# Patient Record
Sex: Male | Born: 1970 | Race: White | Hispanic: No | Marital: Single | Smoking: Former smoker
Health system: Southern US, Community
[De-identification: ages and names within clinical notes are randomized; demographics above are authoritative.]

## PROBLEM LIST (undated history)

## (undated) DIAGNOSIS — F32A Depression, unspecified: Secondary | ICD-10-CM

---

## 2006-06-30 ENCOUNTER — Emergency Department (HOSPITAL_COMMUNITY): Admission: EM | Admit: 2006-06-30 | Discharge: 2006-06-30 | Payer: Self-pay | Admitting: Emergency Medicine

## 2006-10-15 ENCOUNTER — Emergency Department (HOSPITAL_COMMUNITY): Admission: EM | Admit: 2006-10-15 | Discharge: 2006-10-15 | Payer: Self-pay | Admitting: Emergency Medicine

## 2006-10-20 ENCOUNTER — Emergency Department (HOSPITAL_COMMUNITY): Admission: EM | Admit: 2006-10-20 | Discharge: 2006-10-21 | Payer: Self-pay | Admitting: Emergency Medicine

## 2006-10-22 ENCOUNTER — Ambulatory Visit: Payer: Self-pay | Admitting: Family Medicine

## 2006-10-23 ENCOUNTER — Ambulatory Visit: Payer: Self-pay | Admitting: Family Medicine

## 2006-10-29 ENCOUNTER — Emergency Department (HOSPITAL_COMMUNITY): Admission: EM | Admit: 2006-10-29 | Discharge: 2006-10-30 | Payer: Self-pay | Admitting: Emergency Medicine

## 2006-10-30 ENCOUNTER — Ambulatory Visit: Payer: Self-pay | Admitting: Family Medicine

## 2006-10-31 ENCOUNTER — Ambulatory Visit: Payer: Self-pay | Admitting: *Deleted

## 2006-11-06 ENCOUNTER — Ambulatory Visit: Payer: Self-pay | Admitting: Family Medicine

## 2006-11-13 ENCOUNTER — Ambulatory Visit: Payer: Self-pay | Admitting: Family Medicine

## 2007-03-01 DIAGNOSIS — R1013 Epigastric pain: Secondary | ICD-10-CM | POA: Insufficient documentation

## 2007-03-01 DIAGNOSIS — K0510 Chronic gingivitis, plaque induced: Secondary | ICD-10-CM | POA: Insufficient documentation

## 2008-03-07 ENCOUNTER — Emergency Department (HOSPITAL_COMMUNITY): Admission: EM | Admit: 2008-03-07 | Discharge: 2008-03-07 | Payer: Self-pay | Admitting: Emergency Medicine

## 2008-03-08 ENCOUNTER — Emergency Department (HOSPITAL_COMMUNITY): Admission: EM | Admit: 2008-03-08 | Discharge: 2008-03-08 | Payer: Self-pay | Admitting: Emergency Medicine

## 2008-03-12 ENCOUNTER — Emergency Department (HOSPITAL_COMMUNITY): Admission: EM | Admit: 2008-03-12 | Discharge: 2008-03-12 | Payer: Self-pay | Admitting: Emergency Medicine

## 2008-03-17 ENCOUNTER — Emergency Department (HOSPITAL_COMMUNITY): Admission: EM | Admit: 2008-03-17 | Discharge: 2008-03-17 | Payer: Self-pay | Admitting: Family Medicine

## 2008-07-31 ENCOUNTER — Emergency Department (HOSPITAL_COMMUNITY): Admission: EM | Admit: 2008-07-31 | Discharge: 2008-07-31 | Payer: Self-pay | Admitting: Family Medicine

## 2008-09-28 ENCOUNTER — Emergency Department (HOSPITAL_COMMUNITY): Admission: EM | Admit: 2008-09-28 | Discharge: 2008-09-28 | Payer: Self-pay | Admitting: Family Medicine

## 2008-11-08 ENCOUNTER — Emergency Department (HOSPITAL_COMMUNITY): Admission: EM | Admit: 2008-11-08 | Discharge: 2008-11-08 | Payer: Self-pay | Admitting: Emergency Medicine

## 2008-12-10 ENCOUNTER — Emergency Department (HOSPITAL_COMMUNITY): Admission: EM | Admit: 2008-12-10 | Discharge: 2008-12-10 | Payer: Self-pay | Admitting: Family Medicine

## 2009-02-02 ENCOUNTER — Emergency Department (HOSPITAL_COMMUNITY): Admission: EM | Admit: 2009-02-02 | Discharge: 2009-02-02 | Payer: Self-pay | Admitting: Emergency Medicine

## 2009-02-02 ENCOUNTER — Inpatient Hospital Stay (HOSPITAL_COMMUNITY): Admission: EM | Admit: 2009-02-02 | Discharge: 2009-02-03 | Payer: Self-pay | Admitting: Internal Medicine

## 2009-02-02 ENCOUNTER — Observation Stay (HOSPITAL_COMMUNITY): Admission: EM | Admit: 2009-02-02 | Discharge: 2009-02-02 | Payer: Self-pay | Admitting: Emergency Medicine

## 2009-02-02 ENCOUNTER — Ambulatory Visit: Payer: Self-pay | Admitting: Internal Medicine

## 2009-02-03 ENCOUNTER — Encounter: Payer: Self-pay | Admitting: Internal Medicine

## 2009-02-12 ENCOUNTER — Encounter: Payer: Self-pay | Admitting: Internal Medicine

## 2009-02-12 ENCOUNTER — Ambulatory Visit: Payer: Self-pay | Admitting: Infectious Diseases

## 2009-02-12 DIAGNOSIS — D709 Neutropenia, unspecified: Secondary | ICD-10-CM

## 2009-02-12 LAB — CONVERTED CEMR LAB
Basophils Absolute: 0 10*3/uL (ref 0.0–0.1)
Eosinophils Absolute: 0.1 10*3/uL (ref 0.0–0.7)
Eosinophils Relative: 2 % (ref 0–5)
HCT: 44.7 % (ref 39.0–52.0)
Hemoglobin: 15 g/dL (ref 13.0–17.0)
MCV: 81.1 fL (ref >=78.0)
Monocytes Absolute: 0.5 10*3/uL (ref 0.1–1.0)
Platelets: 182 10*3/uL (ref 150–400)
RBC: 5.51 M/uL (ref 4.22–5.81)
WBC: 5.2 10*3/uL (ref 4.0–10.5)

## 2009-02-17 ENCOUNTER — Ambulatory Visit: Payer: Self-pay | Admitting: Infectious Diseases

## 2009-02-17 DIAGNOSIS — L02619 Cutaneous abscess of unspecified foot: Secondary | ICD-10-CM | POA: Insufficient documentation

## 2009-02-17 DIAGNOSIS — L03119 Cellulitis of unspecified part of limb: Secondary | ICD-10-CM

## 2009-02-17 LAB — CONVERTED CEMR LAB
Basophils Relative: 1 % (ref 0–1)
Eosinophils Relative: 4 % (ref 0–5)
HCT: 45.3 % (ref 39.0–52.0)
Lymphs Abs: 2.8 10*3/uL (ref 0.7–4.0)
MCV: 81.5 fL (ref >=78.0)
Neutro Abs: 2.3 10*3/uL (ref 1.7–7.7)
Neutrophils Relative %: 39 % — ABNORMAL LOW (ref 43–77)
RBC: 5.56 M/uL (ref 4.22–5.81)
WBC: 6 10*3/uL (ref 4.0–10.5)

## 2009-03-07 ENCOUNTER — Emergency Department (HOSPITAL_COMMUNITY): Admission: EM | Admit: 2009-03-07 | Discharge: 2009-03-07 | Payer: Self-pay | Admitting: Emergency Medicine

## 2009-05-17 ENCOUNTER — Emergency Department (HOSPITAL_COMMUNITY): Admission: EM | Admit: 2009-05-17 | Discharge: 2009-05-17 | Payer: Self-pay | Admitting: Emergency Medicine

## 2009-05-20 ENCOUNTER — Emergency Department (HOSPITAL_COMMUNITY): Admission: EM | Admit: 2009-05-20 | Discharge: 2009-05-20 | Payer: Self-pay | Admitting: Emergency Medicine

## 2009-05-21 ENCOUNTER — Inpatient Hospital Stay (HOSPITAL_COMMUNITY): Admission: AD | Admit: 2009-05-21 | Discharge: 2009-05-23 | Payer: Self-pay | Admitting: Gastroenterology

## 2009-05-22 ENCOUNTER — Encounter (INDEPENDENT_AMBULATORY_CARE_PROVIDER_SITE_OTHER): Payer: Self-pay | Admitting: Gastroenterology

## 2009-12-02 ENCOUNTER — Emergency Department (HOSPITAL_COMMUNITY): Admission: EM | Admit: 2009-12-02 | Discharge: 2009-12-02 | Payer: Self-pay | Admitting: Emergency Medicine

## 2010-08-21 LAB — HERPES SIMPLEX VIRUS CULTURE

## 2010-09-05 LAB — DIFFERENTIAL
Blasts: 0 %
Lymphocytes Relative: 71 % — ABNORMAL HIGH (ref 12–46)
Lymphocytes Relative: 88 % — ABNORMAL HIGH (ref 12–46)
Monocytes Absolute: 0.1 10*3/uL (ref 0.1–1.0)
Monocytes Absolute: 0.5 10*3/uL (ref 0.1–1.0)
Myelocytes: 0 %
Neutro Abs: 0.1 10*3/uL — ABNORMAL LOW (ref 1.7–7.7)
Promyelocytes Absolute: 0 %
nRBC: 0 /100 WBC

## 2010-09-05 LAB — POCT CARDIAC MARKERS
CKMB, poc: <1 ng/mL — ABNORMAL LOW (ref 1.0–8.0)
Myoglobin, poc: 88.1 ng/mL (ref 12–200)
Troponin i, poc: <0.05 ng/mL (ref 0.00–0.09)

## 2010-09-05 LAB — CBC
MCV: 79.6 fL (ref 78.0–100.0)
MCV: 80.7 fL (ref 78.0–100.0)
WBC: 2.8 10*3/uL — ABNORMAL LOW (ref 4.0–10.5)

## 2010-09-05 LAB — URINALYSIS, ROUTINE W REFLEX MICROSCOPIC
Glucose, UA: NEGATIVE mg/dL
Ketones, ur: 40 mg/dL — AB
Protein, ur: 30 mg/dL — AB
Urobilinogen, UA: 1 mg/dL (ref 0.0–1.0)
pH: 6 (ref 5.0–8.0)

## 2010-09-05 LAB — COMPREHENSIVE METABOLIC PANEL
Glucose, Bld: 93 mg/dL (ref 70–99)
Potassium: 3.8 mEq/L (ref 3.5–5.1)
Sodium: 134 mEq/L — ABNORMAL LOW (ref 135–145)
Total Bilirubin: 1 mg/dL (ref 0.3–1.2)

## 2010-09-05 LAB — POCT I-STAT, CHEM 8
Calcium, Ion: 1.13 mmol/L (ref 1.12–1.32)
Chloride: 102 mEq/L (ref 96–112)
Creatinine, Ser: 1 mg/dL (ref 0.4–1.5)
Glucose, Bld: 107 mg/dL — ABNORMAL HIGH (ref 70–99)
Potassium: 3.8 mEq/L (ref 3.5–5.1)
TCO2: 31 mmol/L (ref 0–100)

## 2010-09-05 LAB — HIV ANTIBODY (ROUTINE TESTING W REFLEX): HIV: NONREACTIVE

## 2010-09-05 LAB — URINE MICROSCOPIC-ADD ON

## 2010-09-06 LAB — CBC
HCT: 42.5 % (ref 39.0–52.0)
Hemoglobin: 14.5 g/dL (ref 13.0–17.0)
MCHC: 34.2 g/dL (ref 30.0–36.0)
MCV: 80.7 fL (ref 78.0–100.0)
Platelets: 233 K/uL (ref 150–400)
RBC: 5.27 MIL/uL (ref 4.22–5.81)
RDW: 14.5 % (ref 11.5–15.5)
WBC: 2 K/uL — ABNORMAL LOW (ref 4.0–10.5)

## 2010-09-06 LAB — DIFFERENTIAL
Basophils Absolute: 0 K/uL (ref 0.0–0.1)
Basophils Relative: 1 % (ref 0–1)
Eosinophils Absolute: 0.1 K/uL (ref 0.0–0.7)
Eosinophils Relative: 4 % (ref 0–5)
Lymphocytes Relative: 79 % — ABNORMAL HIGH (ref 12–46)
Lymphs Abs: 1.6 K/uL (ref 0.7–4.0)
Monocytes Absolute: 0.3 K/uL (ref 0.1–1.0)
Monocytes Relative: 15 % — ABNORMAL HIGH (ref 3–12)
Neutro Abs: 0 K/uL — ABNORMAL LOW (ref 1.7–7.7)
Neutrophils Relative %: 1 % — ABNORMAL LOW (ref 43–77)

## 2010-09-06 LAB — BASIC METABOLIC PANEL WITH GFR
BUN: 11 mg/dL (ref 6–23)
CO2: 25 meq/L (ref 19–32)
Calcium: 8.8 mg/dL (ref 8.4–10.5)
Chloride: 102 meq/L (ref 96–112)
Creatinine, Ser: 0.8 mg/dL (ref 0.4–1.5)
GFR calc non Af Amer: >60 mL/min
Glucose, Bld: 96 mg/dL (ref 70–99)
Potassium: 3.8 meq/L (ref 3.5–5.1)
Sodium: 134 meq/L — ABNORMAL LOW (ref 135–145)

## 2010-09-06 LAB — POCT CARDIAC MARKERS
CKMB, poc: <1 ng/mL — ABNORMAL LOW (ref 1.0–8.0)
Troponin i, poc: <0.05 ng/mL (ref 0.00–0.09)

## 2010-09-08 LAB — CBC
HCT: 42.7 % (ref 39.0–52.0)
Hemoglobin: 14.4 g/dL (ref 13.0–17.0)
RBC: 5.23 MIL/uL (ref 4.22–5.81)
RDW: 16.3 % — ABNORMAL HIGH (ref 11.5–15.5)
WBC: 8.6 10*3/uL (ref 4.0–10.5)

## 2010-09-08 LAB — DIFFERENTIAL
Basophils Absolute: 0 10*3/uL (ref 0.0–0.1)
Eosinophils Relative: 2 % (ref 0–5)
Lymphocytes Relative: 19 % (ref 12–46)
Lymphs Abs: 1.6 10*3/uL (ref 0.7–4.0)
Monocytes Absolute: 0.7 10*3/uL (ref 0.1–1.0)
Monocytes Relative: 8 % (ref 3–12)
Neutro Abs: 6.1 10*3/uL (ref 1.7–7.7)

## 2010-09-09 LAB — PATHOLOGIST SMEAR REVIEW

## 2010-09-09 LAB — CBC
HCT: 38.8 % — ABNORMAL LOW (ref 39.0–52.0)
Hemoglobin: 13.3 g/dL (ref 13.0–17.0)
MCHC: 34.3 g/dL (ref 30.0–36.0)
RBC: 4.8 MIL/uL (ref 4.22–5.81)

## 2010-09-09 LAB — DIFFERENTIAL
Basophils Relative: 1 % (ref 0–1)
Eosinophils Relative: 6 % — ABNORMAL HIGH (ref 0–5)
Lymphocytes Relative: 65 % — ABNORMAL HIGH (ref 12–46)
Monocytes Absolute: 0.6 10*3/uL (ref 0.1–1.0)
Neutro Abs: 0.7 10*3/uL — ABNORMAL LOW (ref 1.7–7.7)
Neutrophils Relative %: 15 % — ABNORMAL LOW (ref 43–77)

## 2010-09-09 LAB — BASIC METABOLIC PANEL
CO2: 28 mEq/L (ref 19–32)
Calcium: 8.5 mg/dL (ref 8.4–10.5)
GFR calc Af Amer: >60 mL/min (ref >60)
Potassium: 4.3 mEq/L (ref 3.5–5.1)
Sodium: 137 mEq/L (ref 135–145)

## 2010-09-09 LAB — HEMOGLOBIN A1C: Mean Plasma Glucose: 117 mg/dL

## 2010-09-09 LAB — LIPID PANEL
LDL Cholesterol: 83 mg/dL (ref 0–99)
Triglycerides: 56 mg/dL (ref <150)

## 2010-09-10 LAB — POCT I-STAT, CHEM 8
Calcium, Ion: 1.14 mmol/L (ref 1.12–1.32)
Chloride: 105 mEq/L (ref 96–112)
Glucose, Bld: 98 mg/dL (ref 70–99)
HCT: 45 % (ref 39.0–52.0)
Hemoglobin: 15.3 g/dL (ref 13.0–17.0)

## 2010-09-10 LAB — CBC
HCT: 43 % (ref 39.0–52.0)
Hemoglobin: 14.6 g/dL (ref 13.0–17.0)
MCHC: 33.9 g/dL (ref 30.0–36.0)
MCV: 80.1 fL (ref 78.0–100.0)
MCV: 80.5 fL (ref 78.0–100.0)
Platelets: 187 10*3/uL (ref 150–400)
RBC: 5.33 MIL/uL (ref 4.22–5.81)
WBC: 3.8 10*3/uL — ABNORMAL LOW (ref 4.0–10.5)

## 2010-09-10 LAB — BASIC METABOLIC PANEL
BUN: 15 mg/dL (ref 6–23)
Chloride: 102 mEq/L (ref 96–112)
Creatinine, Ser: 0.89 mg/dL (ref 0.4–1.5)

## 2010-09-10 LAB — DIFFERENTIAL
Basophils Absolute: 0 10*3/uL (ref 0.0–0.1)
Basophils Relative: 1 % (ref 0–1)
Basophils Relative: 1 % (ref 0–1)
Eosinophils Absolute: 0.1 10*3/uL (ref 0.0–0.7)
Eosinophils Absolute: 0.1 10*3/uL (ref 0.0–0.7)
Eosinophils Relative: 2 % (ref 0–5)
Lymphs Abs: 1.8 10*3/uL (ref 0.7–4.0)
Monocytes Absolute: 0.6 10*3/uL (ref 0.1–1.0)
Monocytes Relative: 12 % (ref 3–12)
Neutrophils Relative %: 37 % — ABNORMAL LOW (ref 43–77)

## 2010-09-10 LAB — HIV ANTIBODY (ROUTINE TESTING W REFLEX): HIV: NONREACTIVE

## 2010-09-10 LAB — SEDIMENTATION RATE: Sed Rate: 24 mm/hr — ABNORMAL HIGH (ref 0–16)

## 2010-10-17 ENCOUNTER — Encounter: Payer: Self-pay | Admitting: Internal Medicine

## 2010-10-18 NOTE — Discharge Summary (Signed)
NAMEKYROS, SALZWEDEL              ACCOUNT NO.:  [id number]   MEDICAL RECORD NO.:  [id number]          PATIENT TYPE:  INP   LOCATION:  5015                         FACILITY:  [facility]   PHYSICIAN:  C. Ulyess Mort, M.D.DATE OF BIRTH:  08-04-1970   DATE OF ADMISSION:  02/02/2009  DATE OF DISCHARGE:  02/03/2009                               DISCHARGE SUMMARY   DISCHARGE DIAGNOSIS:  Left foot cellulitis.   MEDICATIONS AT DISCHARGE:  1. Doxycycline 100 mg p.o. b.i.d. for 10 days.  2. Vicodin 5/500 mg 1-2 tablets p.o. q.6 h p.r.n. pain, #40 given.   DISPOSITION AND FOLLOWUP:  The patient is to follow up with [facility] in approximately 1 week.  Clinic was  currently closed and information flagged for appointment to be set up,  and the patient will be called with appointment details.  During  followup, the patient is to have the left ankle assessed for resolution  of cellulitis.  Patient needs a follow up CBC with differential to  assess his neutropenia (please see below for details).  If neutropenia  persists, will need further work up as outpatient.   IMAGING:  The patient had x-ray, plain film of the left ankle without  any acute findings.   BRIEF ADMITTING HISTORY AND PHYSICAL:  [name] is a 40 year old  gentleman without any significant past medical history.  He presented to  the emergency department on February 01, 2009, with chief complaint of  left ankle pain, swelling, and redness.  Symptoms began approximately 3  days prior to admission.  He reports sitting down, watching television,  and noticing mild ache in left ankle.  These symptoms were progressive  in nature with increasing redness, swelling, and pain.  This became  severe enough to where he could not bear weight on that foot.  This  prompted the visit to the emergency department on February 01, 2009.  He  was evaluated in the emergency department and given vancomycin and  observed  overnight until February 02, 2009.  The margins of the erythema  were marked.  At that time, the patient was discharged on the morning of  February 02, 2009, with oral antibiotics.  Of note, the patient returned  that afternoon with increasing redness and pain of that foot.  Upon  reexam, the erythema had spread beyond the margins marked that morning.  The patient was subsequently admitted for continued IV antibiotics.   PHYSICAL EXAMINATION:  VITAL SIGNS:  Temperature 98.3, blood pressure  123/66, heart rate of 84, respirations 18, O2 saturation 97% on room  air.  GENERAL:  He is a 40 year old healthy appearing gentleman in no acute  distress.  HEENT:  Head is atraumatic and normocephalic.  Pupils equally round and  reactive to light and accommodation.  Extraocular movements are intact.  Sclerae is anicteric.  There is no conjunctival injection.  NECK:  Supple.  Full range of motion, no thyromegaly, lymphadenopathy,  or carotid bruits.  LUNGS:  Clear to auscultation bilaterally without any crackles, rhonchi,  or wheezing.  CARDIOVASCULAR:  He has got a regular  S1, S2 with a regular rhythm.  There are no murmurs, gallops, or rubs.  There is no JVD.  ABDOMEN:  Positive bowel sounds, soft, nontender, nondistended.  No  hepatosplenomegaly noted.  MUSCULOSKELETAL:  Left ankle was grossly edematous compared to the  right.  There is erythema, is going from bilateral malleoli to  posteriorly toward the Achilles tendon.  There is marked tenderness on  palpation of the posterior ankle.  There is also marked tenderness with  active and passive motion of left ankle.  All other joints are without  erythema, swelling, or tenderness to palpation.  NECK:  Pulse is 2+ dorsalis pedis and posterior tibial pulses  bilaterally.  NEUROLOGIC:  The patient was awake, alert, and oriented x3.  Cranial  nerves II through XII grossly intact.  Motor was intact.  Sensory was  intact.  Cerebellar was intact.  SKIN:   As  per musculoskeletal, there is erythema on the left ankle from  bilateral malleoli posteriorly towards the Achilles tendon.   LABORATORY DATA ON ADMISSION:  WBC of 3.8; hemoglobin 14.4; platelet  count of 187,000.  ANC was 1.4, sodium 138, potassium 3.3, chloride 102,  bicarb 27, glucose 74, BUN 15, creatinine 0.89, calcium 8.9, magnesium  2.3, phosphorus 3.5.  ESR was 24.  Of note, the patient had a lipid  profile with an LDL of 83, A1c of 5.7.   HOSPITAL COURSE:  1. Left foot cellulitis.  The patient's initial worsening was presumed      to be secondary to insufficient time for vancomycin to have affect.      The patient was admitted and continue with vancomycin given he did      not have a white count or systemic symptoms.  The patient was      observed at that point.  He had marked improvement clinically.      Although, the erythema spread some more beyond the margins.  His      tenderness markedly decreased and the patient was able to walk and      bear weight without much of problem.  At this time, the patient was      transitioned to doxycycline p.o. and discharged with a 10-day      supply and to follow up in clinic.  The patient had a sed rate that      was mildly elevated at 24, had CRP that was also elevated at 7.4.      HIV was nonreactive and GC Chlamydia is pending.  2. Neutropenia.  The patient was noted to be mildly neutropenic with      an A1c of 1.3 and 1.4 initially and 0.7 (700) on the day of      discharge.  The patient was also noted to have a similar episode      previously without apparent cause.  This had resolved on subsequent      labs during prior visits to the emergency department.  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  This is to be followed on an outpatient basis with      further workup as indicated.      Mariea Stable, MD  Electronically Signed      C. Ulyess Mort,  M.D.  Electronically Signed    MA/MEDQ  D:  02/03/2009  T:  02/04/2009  Job:  [id number]

## 2011-01-14 ENCOUNTER — Emergency Department (HOSPITAL_COMMUNITY)
Admission: EM | Admit: 2011-01-14 | Discharge: 2011-01-14 | Disposition: A | Discharge status: Home / Self Care | Payer: Self-pay | Attending: Emergency Medicine | Admitting: Emergency Medicine

## 2011-01-14 DIAGNOSIS — F17200 Nicotine dependence, unspecified, uncomplicated: Secondary | ICD-10-CM | POA: Insufficient documentation

## 2011-01-14 DIAGNOSIS — K219 Gastro-esophageal reflux disease without esophagitis: Secondary | ICD-10-CM | POA: Insufficient documentation

## 2011-01-14 DIAGNOSIS — K089 Disorder of teeth and supporting structures, unspecified: Secondary | ICD-10-CM | POA: Insufficient documentation

## 2011-01-14 DIAGNOSIS — Z9889 Other specified postprocedural states: Secondary | ICD-10-CM | POA: Insufficient documentation

## 2011-03-07 LAB — DIFFERENTIAL
Eosinophils Absolute: 0.1
Eosinophils Relative: 1
Lymphocytes Relative: 34
Lymphs Abs: 2
Monocytes Absolute: 0.5

## 2011-03-07 LAB — BASIC METABOLIC PANEL
BUN: 19
Chloride: 103
GFR calc non Af Amer: >60
Glucose, Bld: 158 — ABNORMAL HIGH
Potassium: 3.4 — ABNORMAL LOW
Sodium: 135

## 2011-03-07 LAB — CBC
HCT: 43.5
Hemoglobin: 14.7
MCV: 82.1
WBC: 6

## 2011-09-04 ENCOUNTER — Encounter (HOSPITAL_COMMUNITY): Payer: Self-pay | Admitting: *Deleted

## 2011-09-04 ENCOUNTER — Emergency Department (HOSPITAL_COMMUNITY)
Admission: EM | Admit: 2011-09-04 | Discharge: 2011-09-04 | Disposition: A | Discharge status: Home / Self Care | Payer: Self-pay | Attending: Emergency Medicine | Admitting: Emergency Medicine

## 2011-09-04 ENCOUNTER — Other Ambulatory Visit: Payer: Self-pay

## 2011-09-04 DIAGNOSIS — K2289 Other specified disease of esophagus: Secondary | ICD-10-CM

## 2011-09-04 DIAGNOSIS — I498 Other specified cardiac arrhythmias: Secondary | ICD-10-CM | POA: Insufficient documentation

## 2011-09-04 DIAGNOSIS — F17200 Nicotine dependence, unspecified, uncomplicated: Secondary | ICD-10-CM | POA: Insufficient documentation

## 2011-09-04 DIAGNOSIS — K228 Other specified diseases of esophagus: Secondary | ICD-10-CM

## 2011-09-04 LAB — POCT I-STAT, CHEM 8
BUN: 21 mg/dL (ref 6–23)
Calcium, Ion: 1.17 mmol/L (ref 1.12–1.32)
Creatinine, Ser: 1 mg/dL (ref 0.50–1.35)
Glucose, Bld: 106 mg/dL — ABNORMAL HIGH (ref 70–99)
Hemoglobin: 15.3 g/dL (ref 13.0–17.0)
TCO2: 24 mmol/L (ref 0–100)

## 2011-09-04 MED ORDER — GI COCKTAIL ~~LOC~~
30.0000 mL | Freq: Once | ORAL | Status: AC
  Administered 2011-09-04: 30 mL via ORAL
  Filled 2011-09-04: qty 30

## 2011-09-04 MED ORDER — LIDOCAINE VISCOUS 2 % MT SOLN: 5.0000 mL | OROMUCOSAL | Status: AC

## 2011-09-04 MED ORDER — VALACYCLOVIR HCL 1 G PO TABS: 1000.0000 mg | ORAL_TABLET | Freq: Three times a day (TID) | ORAL | Status: AC

## 2011-09-04 NOTE — ED Provider Notes (Signed)
History     CSN: [id number]  Arrival date & time 09/04/11  1830   First MD Initiated Contact with Patient 09/04/11 2055      Chief Complaint  Patient presents with  . multiple symptoms     (Consider location/radiation/quality/duration/timing/severity/associated sxs/prior treatment) HPI Bruce Vaughn is a 41 yo male with a history of GERD, who presents today with razor blade pain substernally whenever he swallows any food or beverages.  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  He was given acyclovir which helped to alleviate the symptoms, but he has not taken this in a while.  Also states he had some fever and chills last night but no temperature was taken.  He denies any SOB, chest pain, N/V/D, abdominal pain, weight changes.    History reviewed. No pertinent past medical history.  History reviewed. No pertinent past surgical history.  No family history on file.  History  Substance Use Topics  . Smoking status: Current Everyday Smoker  . Smokeless tobacco: Not on file  . Alcohol Use: Yes      Review of Systems All pertinent positives and negatives reviewed in the history of present illness  Allergies  Penicillins  Home Medications   Current Outpatient Rx  Name Route Sig Dispense Refill  . DOXYCYCLINE HYCLATE 100 MG PO CAPS Oral Take 100 mg by mouth 2 (two) times daily.      Marland Kitchen HYDROCODONE-ACETAMINOPHEN 5-500 MG PO TABS Oral Take 1 tablet by mouth every 6 (six) hours as needed. For pain       BP 130/84  Pulse 110  Temp(Src) 98.5 F (36.9 C) (Oral)  Resp 22  SpO2 97%  Physical Exam  Constitutional: He is oriented to person, place, and time. He appears well-developed and well-nourished. No distress.  HENT:  Mouth/Throat: Uvula is midline, oropharynx is clear and moist and mucous membranes are normal. No oropharyngeal exudate, posterior  oropharyngeal edema, posterior oropharyngeal erythema or tonsillar abscesses.  Eyes: Pupils are equal, round, and reactive to light.  Cardiovascular: Normal rate, normal heart sounds and intact distal pulses.   Pulmonary/Chest: Effort normal and breath sounds normal. No respiratory distress.  Abdominal: Soft. Bowel sounds are normal. He exhibits no distension. There is no tenderness.  Neurological: He is alert and oriented to person, place, and time.  Skin: Skin is warm. No rash noted. He is not diaphoretic. No erythema.    ED Course  Procedures (including critical care time)  Labs Reviewed - No data to display No results found.  Pt seen and assessed.  Vital signs are stable.  No chest pain or shortness of breath.  Will review prior endoscopy report. 11:30 PM Spoke with patient about possible d/c.  Pt is comfortable being discharged on the Valtrex and viscous lidocaine as before.  He is advised to f/u with GI for further management.  Does not have any other concerns at this point, though he would like some of the lidocaine now as he is hungry and would like to try and eat something soon. MDM  MDM Reviewed: nursing note, vitals and previous chart Reviewed previous: labs Interpretation: labs    Date: 09/05/2011  Rate: 110  Rhythm: sinus tachycardia  QRS Axis: normal  Intervals: normal  ST/T Wave abnormalities: normal  Conduction Disutrbances:none  Narrative Interpretation:   Old EKG Reviewed: unchanged           Cristal Deer  Gabriel Earing, PA-C 09/05/11 0101

## 2011-09-04 NOTE — Discharge Instructions (Signed)
Follow up with your doctor for a recheck. Return here as needed. Slowly increase your fluids.

## 2011-09-04 NOTE — ED Notes (Signed)
The pt c/o not able to eat or swallow since thrusday he is also c/o chest pain.  Testing on his phonce while being traiged

## 2011-09-04 NOTE — ED Notes (Signed)
Patient presents with c/o difficulty swallowing for the past several days.  Denies N/V/D  Stated this happened several years ago

## 2011-09-06 NOTE — ED Provider Notes (Signed)
Medical screening examination/treatment/procedure(s) were performed by non-physician practitioner and as supervising physician I was immediately available for consultation/collaboration.   Shelda Jakes, MD 09/06/11 [phone]

## 2011-10-03 ENCOUNTER — Emergency Department (HOSPITAL_COMMUNITY)
Admission: EM | Admit: 2011-10-03 | Discharge: 2011-10-04 | Disposition: A | Discharge status: Home / Self Care | Payer: Self-pay | Attending: Emergency Medicine | Admitting: Emergency Medicine

## 2011-10-03 ENCOUNTER — Emergency Department (HOSPITAL_COMMUNITY): Payer: Self-pay

## 2011-10-03 ENCOUNTER — Encounter (HOSPITAL_COMMUNITY): Payer: Self-pay | Admitting: *Deleted

## 2011-10-03 DIAGNOSIS — S92919A Unspecified fracture of unspecified toe(s), initial encounter for closed fracture: Secondary | ICD-10-CM | POA: Insufficient documentation

## 2011-10-03 DIAGNOSIS — W2209XA Striking against other stationary object, initial encounter: Secondary | ICD-10-CM | POA: Insufficient documentation

## 2011-10-03 MED ORDER — BUPIVACAINE HCL (PF) 0.5 % IJ SOLN
10.0000 mL | Freq: Once | INTRAMUSCULAR | Status: AC
  Administered 2011-10-03: 10 mL
  Filled 2011-10-03: qty 10

## 2011-10-03 NOTE — ED Notes (Signed)
Pt c/o right toe pain, states "stubbed toe on couch"  Last toe is deformed, cannot move it, pain to center of foot.

## 2011-10-04 MED ORDER — HYDROCODONE-ACETAMINOPHEN 5-500 MG PO TABS: 1.0000 | ORAL_TABLET | Freq: Four times a day (QID) | ORAL | Status: DC | PRN

## 2011-10-04 NOTE — ED Provider Notes (Signed)
Medical screening examination/treatment/procedure(s) were performed by non-physician practitioner and as supervising physician I was immediately available for consultation/collaboration.   Laray Anger, DO 10/04/11 [phone]

## 2011-10-04 NOTE — ED Provider Notes (Signed)
History     CSN: [id number]  Arrival date & time 10/03/11  2230   First MD Initiated Contact with Patient 10/04/11 0003      Chief Complaint  Patient presents with  . Toe Injury    (Consider location/radiation/quality/duration/timing/severity/associated sxs/prior treatment) HPI Comments: Patient was walking in socks and hit a door frame with his right foot.  He now has a deformity of the right fifth toe  The history is provided by the patient.    History reviewed. No pertinent past medical history.  History reviewed. No pertinent past surgical history.  History reviewed. No pertinent family history.  History  Substance Use Topics  . Smoking status: Current Everyday Smoker  . Smokeless tobacco: Not on file  . Alcohol Use: Yes      Review of Systems  Constitutional: Negative for fever.  Musculoskeletal: Positive for joint swelling.  Skin: Negative for pallor and wound.    Allergies  Penicillins  Home Medications   Current Outpatient Rx  Name Route Sig Dispense Refill  . ALBUTEROL SULFATE HFA 108 (90 BASE) MCG/ACT IN AERS Inhalation Inhale 2 puffs into the lungs every 6 (six) hours as needed. For wheezing    . LANSOPRAZOLE 30 MG PO CPDR Oral Take 30 mg by mouth daily.      BP 118/72  Pulse 91  Temp(Src) 98.2 F (36.8 C) (Oral)  Resp 16  SpO2 98%  Physical Exam  Constitutional: He is oriented to person, place, and time. He appears well-developed and well-nourished.  HENT:  Head: Normocephalic.  Eyes: Pupils are equal, round, and reactive to light.  Neck: Normal range of motion.  Cardiovascular: Normal rate.   Pulmonary/Chest: Effort normal.  Musculoskeletal:       Obvious deformity of the right fifth toe.  No skin Refill less than 3 seconds  Neurological: He is alert and oriented to person, place, and time.  Skin: Skin is warm. No rash noted. No erythema.    ED Course  NERVE BLOCK Date/Time: 10/04/2011 12:21 AM Performed by: Arman Filter Authorized by: Arman Filter  ORTHOPEDIC INJURY TREATMENT Date/Time: 10/04/2011 12:22 AM Performed by: Arman Filter Authorized by: Arman Filter Consent: Verbal consent obtained. Risks and benefits: risks, benefits and alternatives were discussed Consent given by: patient Patient identity confirmed: verbally with patient Time out: Immediately prior to procedure a "time out" was called to verify the correct patient, procedure, equipment, support staff and site/side marked as required. Injury location: toe Location details: right fifth toe Injury type: fracture Fracture type: proximal phalanx Pre-procedure neurovascular assessment: neurovascularly intact Pre-procedure distal perfusion: normal Pre-procedure neurological function: normal Pre-procedure range of motion: normal Local anesthesia used: yes Anesthesia: digital block Local anesthetic: bupivacaine 0.5% without epinephrine and lidocaine 1% without epinephrine Anesthetic total: 3 ml Patient sedated: no Manipulation performed: yes Immobilization: tape Post-procedure neurovascular assessment: post-procedure neurovascularly intact Post-procedure distal perfusion: normal Post-procedure neurological function: normal Post-procedure range of motion: normal Patient tolerance: Patient tolerated the procedure well with no immediate complications.   (including critical care time)  Labs Reviewed - No data to display Dg Foot Complete Right  10/03/2011  *RADIOLOGY REPORT*  Clinical Data: Kicked foot against couch, now with pain and obvious deformity of the fifth toe  RIGHT FOOT COMPLETE - 3+ VIEW  Comparison: None.  Findings: There is a minimally displaced fracture of the distal diaphysis of the proximal phalanx of the fifth digit with associated mild angulation of the fracture fragments.  There is no  definite extension of the fracture into the PIP joint.  Minimal amount of expected adjacent soft tissue swelling.  No radiopaque foreign  body.  No additional fractures identified.  Joint spaces are preserved.  IMPRESSION: Minimally displaced fracture the distal diaphysis of the proximal phalanx of the fifth digit without definite intra-articular extension.  Original Report Authenticated By: Waynard Reeds, M.D.     No diagnosis found.    MDM  X-ray reveals a fracture of the proximal right fifth phalanx without intra-articular involvement.  Will digitally block and by the tape to the fourth toe with orthopedic followup        Arman Filter, NP 10/04/11 0025  Arman Filter, NP 10/04/11 0025

## 2011-10-04 NOTE — Discharge Instructions (Signed)
Toe Fracture  Your caregiver has diagnosed you as having a fractured toe. A toe fracture is a break in the bone of a toe. "Buddy taping" is a way of splinting your broken toe, by taping the broken toe to the toe next to it. This "buddy taping" will keep the injured toe from moving beyond normal range of motion. Buddy taping also helps the toe heal in a more normal alignment. It may take 6 to 8 weeks for the toe injury to heal.  HOME CARE INSTRUCTIONS   · Leave your toes taped together for as long as directed by your caregiver or until you see a doctor for a follow-up examination. You can change the tape after bathing. Always use a small piece of gauze or cotton between the toes when taping them together. This will help the skin stay dry and prevent infection.  · Apply ice to the injury for 15 to 20 minutes each hour while awake for the first 2 days. Put the ice in a plastic bag and place a towel between the bag of ice and your skin.  · After the first 2 days, apply heat to the injured area. Use heat for the next 2 to 3 days. Place a heating pad on the foot or soak the foot in warm water as directed by your caregiver.  · Keep your foot elevated as much as possible to lessen swelling.  · Wear sturdy, supportive shoes. The shoes should not pinch the toes or fit tightly against the toes.  · Your caregiver may prescribe a rigid shoe if your foot is very swollen.  · Your may be given crutches if the pain is too great and it hurts too much to walk.  · Only take over-the-counter or prescription medicines for pain, discomfort, or fever as directed by your caregiver.  · If your caregiver has given you a follow-up appointment, it is very important to keep that appointment. Not keeping the appointment could result in a chronic or permanent injury, pain, and disability. If there is any problem keeping the appointment, you must call back to this facility for assistance.  SEEK MEDICAL CARE IF:   · You have increased pain or  swelling, not relieved with medications.  · The pain does not get better after 1 week.  · Your injured toe is cold when the others are warm.  SEEK IMMEDIATE MEDICAL CARE IF:   · The toe becomes cold, numb, or white.  · The toe becomes hot (inflamed) and red.  Document Released: 05/19/2000 Document Revised: 05/11/2011 Document Reviewed: 01/06/2008  [employer]® Patient Information ©2012 [employer].

## 2011-10-05 ENCOUNTER — Encounter (HOSPITAL_COMMUNITY): Payer: Self-pay | Admitting: *Deleted

## 2011-10-05 ENCOUNTER — Emergency Department (HOSPITAL_COMMUNITY)
Admission: EM | Admit: 2011-10-05 | Discharge: 2011-10-05 | Disposition: A | Discharge status: Home / Self Care | Payer: Self-pay | Attending: Emergency Medicine | Admitting: Emergency Medicine

## 2011-10-05 DIAGNOSIS — F17200 Nicotine dependence, unspecified, uncomplicated: Secondary | ICD-10-CM | POA: Insufficient documentation

## 2011-10-05 DIAGNOSIS — L03119 Cellulitis of unspecified part of limb: Secondary | ICD-10-CM | POA: Insufficient documentation

## 2011-10-05 DIAGNOSIS — L03115 Cellulitis of right lower limb: Secondary | ICD-10-CM

## 2011-10-05 DIAGNOSIS — L02619 Cutaneous abscess of unspecified foot: Secondary | ICD-10-CM | POA: Insufficient documentation

## 2011-10-05 MED ORDER — CEPHALEXIN 500 MG PO CAPS: 1000.0000 mg | ORAL_CAPSULE | Freq: Two times a day (BID) | ORAL | Status: DC

## 2011-10-05 MED ORDER — OXYCODONE-ACETAMINOPHEN 5-325 MG PO TABS: 1.0000 | ORAL_TABLET | ORAL | Status: DC | PRN

## 2011-10-05 NOTE — Discharge Instructions (Signed)
It appears that you have cellulitis or skin infection to your foot. Keep your foot elevated. Start keflex as prescribed for infection. Take percocet as prescribed for pain. Return Sunday for recheck.   Cellulitis Cellulitis is an infection of the skin and the tissue beneath it. The area is typically red and tender. It is caused by germs (bacteria) (usually staph or strep) that enter the body through cuts or sores. Cellulitis most commonly occurs in the arms or lower legs.  HOME CARE INSTRUCTIONS   If you are given a prescription for medications which kill germs (antibiotics), take as directed until finished.   If the infection is on the arm or leg, keep the limb elevated as able.   Use a warm cloth several times per day to relieve pain and encourage healing.   See your caregiver for recheck of the infected site as directed if problems arise.   Only take over-the-counter or prescription medicines for pain, discomfort, or fever as directed by your caregiver.  SEEK MEDICAL CARE IF:   The area of redness (inflammation) is spreading, there are red streaks coming from the infected site, or if a part of the infection begins to turn dark in color.   The joint or bone underneath the infected skin becomes painful after the skin has healed.   The infection returns in the same or another area after it seems to have gone away.   A boil or bump swells up. This may be an abscess.   New, unexplained problems such as pain or fever develop.  SEEK IMMEDIATE MEDICAL CARE IF:   You have a fever.   You or your child feels drowsy or lethargic.   There is vomiting, diarrhea, or lasting discomfort or feeling ill (malaise) with muscle aches and pains.  MAKE SURE YOU:   Understand these instructions.   Will watch your condition.   Will get help right away if you are not doing well or get worse.  Document Released: 03/01/2005 Document Revised: 05/11/2011 Document Reviewed: 01/08/2008 [facility] Patient  Information 2012 [place], [place].

## 2011-10-05 NOTE — ED Notes (Signed)
The pt was here 2 days ago for an injured rt little toe.  For the past 5 hours he hashad redness swelling to the rt lateral foot.  More pain through out the day.

## 2011-10-05 NOTE — ED Provider Notes (Signed)
Medical screening examination/treatment/procedure(s) were conducted as a shared visit with non-physician practitioner(s) and myself.  I personally evaluated the patient during the encounter  This generally healthy 41 year old male has a right foot with capillary refill less than 2 seconds in all toes, dorsalis pedis pulse intact, normal light touch, able to move all toes, localized cellulitis to the right foot in a patient who is not immunocompromised and has no obvious purulence involvement or abscess or suspicion of septic joint. I believe that outpatient oral antibiotics a reasonable the patient and his significant other understand and agree with this assessment and plan as well. He has a known right foot small toe fracture which is closed, and I suspect is localized cellulitis may have occurred due to the local injection for local anesthetic when he was seen his last visit.  Hurman Horn, MD 10/09/11 2322

## 2011-10-05 NOTE — ED Notes (Signed)
Patient was seen on 4/30 for fractured right small toe and was told that if it became inflamed or feels/looks infected come to ED for re-evaluation.  Patient thinks that his small toe is infected

## 2011-10-05 NOTE — ED Provider Notes (Signed)
History     CSN: [id number]  Arrival date & time 10/05/11  2147   First MD Initiated Contact with Patient 10/05/11 2236      Chief Complaint  Patient presents with  . Toe Injury    (Consider location/radiation/quality/duration/timing/severity/associated sxs/prior treatment) Patient is a 41 y.o. male presenting with lower extremity pain. The history is provided by the patient.  Foot Pain This is a new problem. The current episode started today. The problem occurs constantly. Associated symptoms include arthralgias, chills and joint swelling. Pertinent negatives include no fever, numbness or weakness.  Pt states he broke his right 5th toe 2 days ago. He had it numbed and buddy taped. States today noted toe red, foot red, tender, having chills. No fever. Denis any other complains. State "I think my toe is infected."   History reviewed. No pertinent past medical history.  History reviewed. No pertinent past surgical history.  History reviewed. No pertinent family history.  History  Substance Use Topics  . Smoking status: Current Everyday Smoker  . Smokeless tobacco: Not on file  . Alcohol Use: Yes      Review of Systems  Constitutional: Positive for chills. Negative for fever.  Eyes: Negative.   Respiratory: Negative.   Cardiovascular: Negative.   Musculoskeletal: Positive for joint swelling and arthralgias.  Skin: Positive for color change.  Neurological: Negative for dizziness, weakness and numbness.    Allergies  Penicillins  Home Medications   Current Outpatient Rx  Name Route Sig Dispense Refill  . ALBUTEROL SULFATE HFA 108 (90 BASE) MCG/ACT IN AERS Inhalation Inhale 2 puffs into the lungs every 6 (six) hours as needed. For wheezing    . VITAMIN C PO Oral Take 1 tablet by mouth daily.    Marland Kitchen HYDROCODONE-ACETAMINOPHEN 5-500 MG PO TABS Oral Take 1 tablet by mouth every 6 (six) hours as needed. For pain    . LANSOPRAZOLE 30 MG PO CPDR Oral Take 30 mg by mouth daily.       BP 131/84  Pulse 105  Temp(Src) 98.8 F (37.1 C) (Oral)  Resp 18  SpO2 98%  Physical Exam  Nursing note and vitals reviewed. Constitutional: He is oriented to person, place, and time. He appears well-developed and well-nourished. No distress.  HENT:  Head: Normocephalic.  Eyes: Conjunctivae are normal.  Cardiovascular: Normal rate, regular rhythm and normal heart sounds.   Pulmonary/Chest: Effort normal and breath sounds normal. No respiratory distress.  Musculoskeletal: Normal range of motion.       Right 5th toe appears swollen, deformity noted. No cuts or wounds. There is erythema to the toe and lateral dorsal foot with one red streak going up to the ankle. Area feels warm to the touch, tender  Neurological: He is alert and oriented to person, place, and time.  Skin: Skin is warm and dry.  Psychiatric: He has a normal mood and affect.    ED Course  Procedures (including critical care time)  Exam consistent with cellulitis of right foot, possible from digital block site. Pt afebrile, otherwise healthy. Discussed with dr. Gari Crown, who came and saw the pt. Will d/c home with antibiotics and follow up.   1. Cellulitis of right foot       MDM          Lottie Mussel, PA 10/06/11 [phone]

## 2011-10-09 ENCOUNTER — Emergency Department (HOSPITAL_COMMUNITY)
Admission: EM | Admit: 2011-10-09 | Discharge: 2011-10-09 | Disposition: A | Discharge status: Home / Self Care | Payer: Self-pay | Attending: Emergency Medicine | Admitting: Emergency Medicine

## 2011-10-09 DIAGNOSIS — B370 Candidal stomatitis: Secondary | ICD-10-CM | POA: Insufficient documentation

## 2011-10-09 DIAGNOSIS — B3781 Candidal esophagitis: Secondary | ICD-10-CM | POA: Insufficient documentation

## 2011-10-09 DIAGNOSIS — F17200 Nicotine dependence, unspecified, uncomplicated: Secondary | ICD-10-CM | POA: Insufficient documentation

## 2011-10-09 MED ORDER — NYSTATIN 100000 UNIT/ML MT SUSP: 500000.0000 [IU] | Freq: Four times a day (QID) | OROMUCOSAL | Status: DC

## 2011-10-09 NOTE — ED Notes (Signed)
Patient drinking cold beverage not able to provide accurate temperature.

## 2011-10-09 NOTE — ED Notes (Signed)
On keflex for 4 days. Began having canker sores and white patches in mouth and feeling like throat was sore on Saturday, sores have worsened. 100% on RA, speaks in complete sentences.

## 2011-10-09 NOTE — ED Provider Notes (Addendum)
This chart was scribed for Gwyneth Sprout, MD by Williemae Natter. The patient was seen in room STRE5/STRE5 at 6:41 PM.  History     CSN: [id number]  Arrival date & time 10/09/11  1734   First MD Initiated Contact with Patient 10/09/11 1804      Chief Complaint  Patient presents with  . Allergic Reaction    (Consider location/radiation/quality/duration/timing/severity/associated sxs/prior treatment) HPI Bruce Vaughn is a 41 y.o. male who presents to the Emergency Department complaining of an allergic reaction that started today. Pt has been getting canker sores in mouth since starting prescription of Keflex.   No past medical history on file.  No past surgical history on file.  No family history on file.  History  Substance Use Topics  . Smoking status: Current Everyday Smoker  . Smokeless tobacco: Not on file  . Alcohol Use: Yes      Review of Systems  Constitutional: Negative for fever and chills.  HENT: Positive for mouth sores.   Respiratory: Negative for shortness of breath.   Gastrointestinal: Negative for nausea and vomiting.  Neurological: Negative for weakness.  All other systems reviewed and are negative.    Allergies  Penicillins  Home Medications   Current Outpatient Rx  Name Route Sig Dispense Refill  . ALBUTEROL SULFATE HFA 108 (90 BASE) MCG/ACT IN AERS Inhalation Inhale 2 puffs into the lungs every 6 (six) hours as needed. For wheezing    . VITAMIN C PO Oral Take 1 tablet by mouth daily.    . CEPHALEXIN 500 MG PO CAPS Oral Take 1,000 mg by mouth 2 (two) times daily.    Marland Kitchen HYDROCODONE-ACETAMINOPHEN 5-500 MG PO TABS Oral Take 1 tablet by mouth every 6 (six) hours as needed. For pain    . LANSOPRAZOLE 30 MG PO CPDR Oral Take 30 mg by mouth daily as needed. For heartburn      BP 134/82  Pulse 98  Temp(Src) 98.2 F (36.8 C) (Oral)  Resp 18  SpO2 99%  Physical Exam  Nursing note and vitals reviewed. Constitutional: He is oriented to  person, place, and time. He appears well-developed and well-nourished. No distress.  HENT:  Head: Normocephalic and atraumatic.       White plaques over gums No involvement of the tongue. No uvular erythema or swelling in pharynx  Eyes: EOM are normal.  Neck: Normal range of motion. Neck supple. No tracheal deviation present.  Cardiovascular: Normal rate, regular rhythm and normal heart sounds.   Pulmonary/Chest: Effort normal and breath sounds normal. No respiratory distress.  Musculoskeletal: Normal range of motion. He exhibits no edema.       Foot is no longer ecchymotic and swelling has significantly reduced. Broken toe is still buddy taped.  Lymphadenopathy:    He has no cervical adenopathy.  Neurological: He is alert and oriented to person, place, and time.  Skin: Skin is warm and dry.  Psychiatric: He has a normal mood and affect. His behavior is normal.    ED Course  Procedures (including critical care time)  Labs Reviewed - No data to display No results found.   1. Thrush of mouth and esophagus       MDM  Patient was on Keflex due to a possible cellulitis of his foot after getting injections for a toe relocation. He's been on the Keflex for now 4 days and he started having white plaques in his mouth that are consistent with thrush. On exam today there is no  sign of cellulitis of his foot and given the thrush in the worsening of the throat pain-DC Keflex. Will start him on nystatin mouthwash and have him return for any worsening symptoms. He has no uvular edema or any distress at this time. But this is all thrush and not an actual allergic reaction.  I personally performed the services described in this documentation, which was scribed in my presence.  The recorded information has been reviewed and considered.          Gwyneth Sprout, MD 10/09/11 1859  Gwyneth Sprout, MD 10/09/11 1902

## 2011-10-09 NOTE — Discharge Instructions (Signed)
Candida Infection, Adult A candida infection (also called yeast, fungus and Monilia infection) is an overgrowth of yeast that can occur anywhere on the body. A yeast infection commonly occurs in warm, moist body areas. Usually, the infection remains localized but can spread to become a systemic infection. A yeast infection may be a sign of a more severe disease such as diabetes, leukemia, or AIDS. A yeast infection can occur in both men and women. In women, Candida vaginitis is a vaginal infection. It is one of the most common causes of vaginitis. Men usually do not have symptoms or know they have an infection until other problems develop. Men may find out they have a yeast infection because their sex partner has a yeast infection. Uncircumcised men are more likely to get a yeast infection than circumcised men. This is because the uncircumcised glans is not exposed to air and does not remain as dry as that of a circumcised glans. Older adults may develop yeast infections around dentures. CAUSES  Women  Antibiotics.   Steroid medication taken for a long time.   Being overweight (obese).   Diabetes.   Poor immune condition.   Certain serious medical conditions.   Immune suppressive medications for organ transplant patients.   Chemotherapy.   Pregnancy.   Menstration.   Stress and fatigue.   Intravenous drug use.   Oral contraceptives.   Wearing tight-fitting clothes in the crotch area.   Catching it from a sex partner who has a yeast infection.   Spermicide.   Intravenous, urinary, or other catheters.  Men  Catching it from a sex partner who has a yeast infection.   Having oral or anal sex with a person who has the infection.   Spermicide.   Diabetes.   Antibiotics.   Poor immune system.   Medications that suppress the immune system.   Intravenous drug use.   Intravenous, urinary, or other catheters.  SYMPTOMS  Women  Thick, white vaginal discharge.    Vaginal itching.   Redness and swelling in and around the vagina.   Irritation of the lips of the vagina and perineum.   Blisters on the vaginal lips and perineum.   Painful sexual intercourse.   Low blood sugar (hypoglycemia).   Painful urination.   Bladder infections.   Intestinal problems such as constipation, indigestion, bad breath, bloating, increase in gas, diarrhea, or loose stools.  Men  Men may develop intestinal problems such as constipation, indigestion, bad breath, bloating, increase in gas, diarrhea, or loose stools.   Dry, cracked skin on the penis with itching or discomfort.   Jock itch.   Dry, flaky skin.   Athlete's foot.   Hypoglycemia.  DIAGNOSIS  Women  A history and an exam are performed.   The discharge may be examined under a microscope.   A culture may be taken of the discharge.  Men  A history and an exam are performed.   Any discharge from the penis or areas of cracked skin will be looked at under the microscope and cultured.   Stool samples may be cultured.  TREATMENT  Women  Vaginal antifungal suppositories and creams.   Medicated creams to decrease irritation and itching on the outside of the vagina.   Warm compresses to the perineal area to decrease swelling and discomfort.   Oral antifungal medications.   Medicated vaginal suppositories or cream for repeated or recurrent infections.   Wash and dry the irritation areas before applying the cream.     Eating yogurt with lactobacillus may help with prevention and treatment.   Sometimes painting the vagina with gentian violet solution may help if creams and suppositories do not work.  Men  Antifungal creams and oral antifungal medications.   Sometimes treatment must continue for 30 days after the symptoms go away to prevent recurrence.  HOME CARE INSTRUCTIONS  Women  Use cotton underwear and avoid tight-fitting clothing.   Avoid colored, scented toilet paper and  deodorant tampons or pads.   Do not douche.   Keep your diabetes under control.   Finish all the prescribed medications.   Keep your skin clean and dry.   Consume milk or yogurt with lactobacillus active culture regularly. If you get frequent yeast infections and think that is what the infection is, there are over-the-counter medications that you can get. If the infection does not show healing in 3 days, talk to your caregiver.   Tell your sex partner you have a yeast infection. Your partner may need treatment also, especially if your infection does not clear up or recurs.  Men  Keep your skin clean and dry.   Keep your diabetes under control.   Finish all prescribed medications.   Tell your sex partner that you have a yeast infection so they can be treated if necessary.  SEEK MEDICAL CARE IF:   Your symptoms do not clear up or worsen in one week after treatment.   You have an oral temperature above 102 F (38.9 C).   You have trouble swallowing or eating for a prolonged time.   You develop blisters on and around your vagina.   You develop vaginal bleeding and it is not your menstrual period.   You develop abdominal pain.   You develop intestinal problems as mentioned above.   You get weak or lightheaded.   You have painful or increased urination.   You have pain during sexual intercourse.  MAKE SURE YOU:   Understand these instructions.   Will watch your condition.   Will get help right away if you are not doing well or get worse.  Document Released: 06/29/2004 Document Revised: 05/11/2011 Document Reviewed: 10/11/2009 [employer] Patient Information 2012 [employer]. 

## 2011-10-13 ENCOUNTER — Emergency Department (HOSPITAL_COMMUNITY): Payer: Self-pay | Admitting: Certified Registered Nurse Anesthetist

## 2011-10-13 ENCOUNTER — Encounter (HOSPITAL_COMMUNITY): Payer: Self-pay | Admitting: *Deleted

## 2011-10-13 ENCOUNTER — Emergency Department (HOSPITAL_COMMUNITY)
Admission: EM | Admit: 2011-10-13 | Discharge: 2011-10-13 | Disposition: A | Discharge status: Home / Self Care | Payer: Self-pay | Attending: Orthopedic Surgery | Admitting: Orthopedic Surgery

## 2011-10-13 ENCOUNTER — Emergency Department (HOSPITAL_COMMUNITY): Payer: Self-pay

## 2011-10-13 ENCOUNTER — Encounter (HOSPITAL_COMMUNITY)
Admission: EM | Disposition: A | Discharge status: Home / Self Care | Payer: Self-pay | Source: Home / Self Care | Attending: Emergency Medicine

## 2011-10-13 ENCOUNTER — Encounter (HOSPITAL_COMMUNITY): Payer: Self-pay | Admitting: Certified Registered Nurse Anesthetist

## 2011-10-13 DIAGNOSIS — M65849 Other synovitis and tenosynovitis, unspecified hand: Secondary | ICD-10-CM | POA: Insufficient documentation

## 2011-10-13 DIAGNOSIS — F17200 Nicotine dependence, unspecified, uncomplicated: Secondary | ICD-10-CM | POA: Insufficient documentation

## 2011-10-13 DIAGNOSIS — I891 Lymphangitis: Secondary | ICD-10-CM | POA: Insufficient documentation

## 2011-10-13 DIAGNOSIS — M659 Synovitis and tenosynovitis, unspecified: Secondary | ICD-10-CM

## 2011-10-13 DIAGNOSIS — L02519 Cutaneous abscess of unspecified hand: Secondary | ICD-10-CM | POA: Insufficient documentation

## 2011-10-13 DIAGNOSIS — M79609 Pain in unspecified limb: Secondary | ICD-10-CM | POA: Insufficient documentation

## 2011-10-13 DIAGNOSIS — M65839 Other synovitis and tenosynovitis, unspecified forearm: Secondary | ICD-10-CM | POA: Insufficient documentation

## 2011-10-13 DIAGNOSIS — L03019 Cellulitis of unspecified finger: Secondary | ICD-10-CM | POA: Insufficient documentation

## 2011-10-13 DIAGNOSIS — J45909 Unspecified asthma, uncomplicated: Secondary | ICD-10-CM | POA: Insufficient documentation

## 2011-10-13 HISTORY — PX: I & D EXTREMITY: SHX5045

## 2011-10-13 LAB — DIFFERENTIAL
Basophils Relative: 0 % (ref 0–1)
Eosinophils Absolute: 0 10*3/uL (ref 0.0–0.7)
Lymphocytes Relative: 92 % — ABNORMAL HIGH (ref 12–46)
Neutrophils Relative %: 2 % — ABNORMAL LOW (ref 43–77)

## 2011-10-13 LAB — CBC
Hemoglobin: 14.2 g/dL (ref 13.0–17.0)
MCH: 26.8 pg (ref 26.0–34.0)
MCHC: 34.5 g/dL (ref 30.0–36.0)
Platelets: 205 10*3/uL (ref 150–400)
RDW: 13.8 % (ref 11.5–15.5)

## 2011-10-13 LAB — BASIC METABOLIC PANEL
BUN: 17 mg/dL (ref 6–23)
Calcium: 9.2 mg/dL (ref 8.4–10.5)
Creatinine, Ser: 0.87 mg/dL (ref 0.50–1.35)
GFR calc non Af Amer: >90 mL/min (ref >90)
Glucose, Bld: 95 mg/dL (ref 70–99)

## 2011-10-13 LAB — PROTIME-INR
INR: 0.95 (ref 0.00–1.49)
Prothrombin Time: 12.9 seconds (ref 11.6–15.2)

## 2011-10-13 SURGERY — IRRIGATION AND DEBRIDEMENT EXTREMITY
Anesthesia: General | Site: Thumb | Laterality: Left | Wound class: Dirty or Infected

## 2011-10-13 MED ORDER — HYDROMORPHONE HCL PF 1 MG/ML IJ SOLN
INTRAMUSCULAR | Status: AC
  Filled 2011-10-13: qty 1

## 2011-10-13 MED ORDER — SODIUM CHLORIDE 0.9 % IV SOLN
INTRAVENOUS | Status: DC | PRN
  Administered 2011-10-13: 19:00:00 via INTRAVENOUS

## 2011-10-13 MED ORDER — LIDOCAINE HCL (CARDIAC) 20 MG/ML IV SOLN
INTRAVENOUS | Status: DC | PRN
  Administered 2011-10-13: 60 mg via INTRAVENOUS

## 2011-10-13 MED ORDER — LACTATED RINGERS IV SOLN
INTRAVENOUS | Status: DC | PRN
  Administered 2011-10-13: 19:00:00 via INTRAVENOUS

## 2011-10-13 MED ORDER — SODIUM CHLORIDE 0.9 % IR SOLN
Status: DC | PRN
  Administered 2011-10-13: 1000 mL

## 2011-10-13 MED ORDER — ONDANSETRON HCL 4 MG/2ML IJ SOLN
INTRAMUSCULAR | Status: AC
  Administered 2011-10-13: 4 mg via INTRAVENOUS
  Filled 2011-10-13: qty 2

## 2011-10-13 MED ORDER — FENTANYL CITRATE 0.05 MG/ML IJ SOLN
INTRAMUSCULAR | Status: DC | PRN
  Administered 2011-10-13 (×5): 50 ug via INTRAVENOUS

## 2011-10-13 MED ORDER — SULFAMETHOXAZOLE-TRIMETHOPRIM 800-160 MG PO TABS: 1.0000 | ORAL_TABLET | Freq: Two times a day (BID) | ORAL | Status: DC

## 2011-10-13 MED ORDER — MIDAZOLAM HCL 5 MG/5ML IJ SOLN
INTRAMUSCULAR | Status: DC | PRN
  Administered 2011-10-13: 2 mg via INTRAVENOUS

## 2011-10-13 MED ORDER — PROPOFOL 10 MG/ML IV EMUL
INTRAVENOUS | Status: DC | PRN
  Administered 2011-10-13: 200 mg via INTRAVENOUS

## 2011-10-13 MED ORDER — VANCOMYCIN HCL IN DEXTROSE 1-5 GM/200ML-% IV SOLN
1000.0000 mg | Freq: Once | INTRAVENOUS | Status: AC
  Administered 2011-10-13: 1000 mg via INTRAVENOUS
  Filled 2011-10-13: qty 200

## 2011-10-13 MED ORDER — ONDANSETRON HCL 4 MG/2ML IJ SOLN
4.0000 mg | Freq: Once | INTRAMUSCULAR | Status: AC
  Administered 2011-10-13: 4 mg via INTRAVENOUS

## 2011-10-13 MED ORDER — SODIUM CHLORIDE 0.9 % IV SOLN
INTRAVENOUS | Status: DC
  Administered 2011-10-13: 18:00:00 via INTRAVENOUS

## 2011-10-13 MED ORDER — OXYCODONE-ACETAMINOPHEN 5-325 MG PO TABS: ORAL_TABLET | ORAL | Status: DC

## 2011-10-13 MED ORDER — BUPIVACAINE HCL (PF) 0.25 % IJ SOLN
INTRAMUSCULAR | Status: DC | PRN
  Administered 2011-10-13: 4 mL

## 2011-10-13 MED ORDER — HYDROMORPHONE HCL PF 1 MG/ML IJ SOLN
0.2500 mg | INTRAMUSCULAR | Status: DC | PRN
  Administered 2011-10-13 (×2): 0.5 mg via INTRAVENOUS
  Administered 2011-10-13: 21:00:00 via INTRAVENOUS
  Administered 2011-10-13: 0.5 mg via INTRAVENOUS

## 2011-10-13 MED ORDER — ONDANSETRON HCL 4 MG/2ML IJ SOLN
INTRAMUSCULAR | Status: DC | PRN
  Administered 2011-10-13: 4 mg via INTRAVENOUS

## 2011-10-13 SURGICAL SUPPLY — 54 items
BANDAGE COBAN STERILE 2 (GAUZE/BANDAGES/DRESSINGS) IMPLANT
BANDAGE CONFORM 2  STR LF (GAUZE/BANDAGES/DRESSINGS) IMPLANT
BANDAGE ELASTIC 3 VELCRO ST LF (GAUZE/BANDAGES/DRESSINGS) ×2 IMPLANT
BANDAGE ELASTIC 4 VELCRO ST LF (GAUZE/BANDAGES/DRESSINGS) ×2 IMPLANT
BANDAGE GAUZE ELAST BULKY 4 IN (GAUZE/BANDAGES/DRESSINGS) ×2 IMPLANT
BNDG CMPR 9X4 STRL LF SNTH (GAUZE/BANDAGES/DRESSINGS)
BNDG COHESIVE 1X5 TAN STRL LF (GAUZE/BANDAGES/DRESSINGS) IMPLANT
BNDG ESMARK 4X9 LF (GAUZE/BANDAGES/DRESSINGS) IMPLANT
CLOTH BEACON ORANGE TIMEOUT ST (SAFETY) ×2 IMPLANT
CORDS BIPOLAR (ELECTRODE) ×2 IMPLANT
COVER SURGICAL LIGHT HANDLE (MISCELLANEOUS) ×2 IMPLANT
DECANTER SPIKE VIAL GLASS SM (MISCELLANEOUS) ×2 IMPLANT
DRAIN PENROSE 1/4X12 LTX STRL (WOUND CARE) IMPLANT
DRSG ADAPTIC 3X8 NADH LF (GAUZE/BANDAGES/DRESSINGS) IMPLANT
DRSG EMULSION OIL 3X3 NADH (GAUZE/BANDAGES/DRESSINGS) ×2 IMPLANT
DRSG PAD ABDOMINAL 8X10 ST (GAUZE/BANDAGES/DRESSINGS) ×4 IMPLANT
GAUZE XEROFORM 1X8 LF (GAUZE/BANDAGES/DRESSINGS) ×2 IMPLANT
GLOVE BIO SURGEON STRL SZ7.5 (GLOVE) ×2 IMPLANT
GLOVE BIOGEL PI IND STRL 8 (GLOVE) ×1 IMPLANT
GLOVE BIOGEL PI INDICATOR 8 (GLOVE) ×1
GOWN STRL REIN XL XLG (GOWN DISPOSABLE) ×2 IMPLANT
HANDPIECE INTERPULSE COAX TIP (DISPOSABLE)
KIT BASIN OR (CUSTOM PROCEDURE TRAY) ×2 IMPLANT
KIT ROOM TURNOVER OR (KITS) ×2 IMPLANT
LOOP VESSEL MAXI BLUE (MISCELLANEOUS) ×1 IMPLANT
LOOP VESSEL MINI RED (MISCELLANEOUS) IMPLANT
MANIFOLD NEPTUNE II (INSTRUMENTS) ×2 IMPLANT
NDL HYPO 25X1 1.5 SAFETY (NEEDLE) IMPLANT
NEEDLE HYPO 25X1 1.5 SAFETY (NEEDLE) IMPLANT
NS IRRIG 1000ML POUR BTL (IV SOLUTION) ×2 IMPLANT
PACK ORTHO EXTREMITY (CUSTOM PROCEDURE TRAY) ×2 IMPLANT
PAD ARMBOARD 7.5X6 YLW CONV (MISCELLANEOUS) ×4 IMPLANT
PAD CAST 4YDX4 CTTN HI CHSV (CAST SUPPLIES) IMPLANT
PADDING CAST COTTON 4X4 STRL (CAST SUPPLIES) ×2
SCRUB BETADINE 4OZ XXX (MISCELLANEOUS) ×2 IMPLANT
SET HNDPC FAN SPRY TIP SCT (DISPOSABLE) IMPLANT
SOLUTION BETADINE 4OZ (MISCELLANEOUS) ×2 IMPLANT
SPLINT PLASTER EXTRA FAST 3X15 (CAST SUPPLIES) ×1
SPLINT PLASTER GYPS XFAST 3X15 (CAST SUPPLIES) IMPLANT
SPONGE GAUZE 4X4 12PLY (GAUZE/BANDAGES/DRESSINGS) ×2 IMPLANT
SPONGE LAP 18X18 X RAY DECT (DISPOSABLE) ×1 IMPLANT
SPONGE LAP 4X18 X RAY DECT (DISPOSABLE) ×2 IMPLANT
SUCTION FRAZIER TIP 10 FR DISP (SUCTIONS) ×2 IMPLANT
SUT ETHILON 4 0 PS 2 18 (SUTURE) ×2 IMPLANT
SUT MON AB 5-0 P3 18 (SUTURE) IMPLANT
SYR CONTROL 10ML LL (SYRINGE) IMPLANT
TOWEL OR 17X24 6PK STRL BLUE (TOWEL DISPOSABLE) ×2 IMPLANT
TOWEL OR 17X26 10 PK STRL BLUE (TOWEL DISPOSABLE) ×2 IMPLANT
TUBE ANAEROBIC SPECIMEN COL (MISCELLANEOUS) IMPLANT
TUBE CONNECTING 12X1/4 (SUCTIONS) ×2 IMPLANT
TUBE FEEDING 5FR 15 INCH (TUBING) IMPLANT
UNDERPAD 30X30 INCONTINENT (UNDERPADS AND DIAPERS) ×2 IMPLANT
WATER STERILE IRR 1000ML POUR (IV SOLUTION) ×2 IMPLANT
YANKAUER SUCT BULB TIP NO VENT (SUCTIONS) ×1 IMPLANT

## 2011-10-13 NOTE — Transfer of Care (Signed)
Immediate Anesthesia Transfer of Care Note  Patient: Bruce Vaughn  Procedure(s) Performed: Procedure(s) (LRB): IRRIGATION AND DEBRIDEMENT EXTREMITY (Left)  Patient Location: PACU  Anesthesia Type: General  Level of Consciousness: awake, alert  and oriented  Airway & Oxygen Therapy: Patient Spontanous Breathing and Patient connected to nasal cannula oxygen  Post-op Assessment: Report given to PACU RN and Post -op Vital signs reviewed and stable  Post vital signs: Reviewed and stable  Complications: No apparent anesthesia complications

## 2011-10-13 NOTE — Anesthesia Procedure Notes (Signed)
Procedure Name: LMA Insertion Date/Time: 10/13/2011 6:56 PM Performed by: Julianne Rice Z Pre-anesthesia Checklist: Patient identified, Timeout performed, Emergency Drugs available, Suction available and Patient being monitored Patient Re-evaluated:Patient Re-evaluated prior to inductionOxygen Delivery Method: Circle system utilized Preoxygenation: Pre-oxygenation with 100% oxygen Intubation Type: IV induction Ventilation: Mask ventilation without difficulty LMA: LMA inserted LMA Size: 4.0 Tube type: Oral Number of attempts: 1 Placement Confirmation: breath sounds checked- equal and bilateral Tube secured with: Tape Dental Injury: Teeth and Oropharynx as per pre-operative assessment

## 2011-10-13 NOTE — Preoperative (Signed)
Beta Blockers   Reason not to administer Beta Blockers:Not Applicable 

## 2011-10-13 NOTE — ED Provider Notes (Signed)
History  This chart was scribed for Laray Anger, DO by Melba Coon. The patient was seen in room STRE1/STRE1 and the patient's care was started at 1645    CSN: [id number]  Arrival date & time 10/13/11  1534   First MD Initiated Contact with Patient 10/13/11 1640      Chief Complaint  Patient presents with  . Abscess     HPI Bruce Vaughn is a 41 y.o. male who presents to the Emergency Department complaining of gradual onset and worsening of constant "rash" and "abscess" on his dorsal left thumb since yesterday.  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  States it "drained pus last night" and woke up this morning with increasing pain in his thumb, as well as "red streaking" up his left arm.  Pain worsens with ROM of his left thumb.  Pt is right handed.  Denies fevers.  Does not recall injury to thumb.  Denies focal motor weakness, no tingling/numbness in extremity.    Past Medical History  Diagnosis Date  . Asthma     History reviewed. No pertinent past surgical history.   History  Substance Use Topics  . Smoking status: Current Everyday Smoker  . Smokeless tobacco: Not on file  . Alcohol Use: Yes    Review of Systems 10 Systems reviewed and all are negative for acute change except as noted in the HPI. ROS: Statement: All systems negative except as marked or noted in the HPI; Constitutional: Negative for fever and chills. ; ; Eyes: Negative for eye pain, redness and discharge. ; ; ENMT: Negative for ear pain, hoarseness, nasal congestion, sinus pressure and sore throat. ; ; Cardiovascular: Negative for chest pain, palpitations, diaphoresis, dyspnea and peripheral edema. ; ; Respiratory: Negative for cough, wheezing and stridor. ; ; Gastrointestinal: Negative for nausea, vomiting, diarrhea, abdominal pain, blood in stool, hematemesis, jaundice and rectal bleeding. . ; ; Genitourinary: Negative for  dysuria, flank pain and hematuria. ; ; Musculoskeletal: Negative for back pain and neck pain. Negative for swelling and trauma.; ; Skin: +rash. Negative for pruritus, abrasions, blisters, bruising and skin lesion.; ; Neuro: Negative for headache, lightheadedness and neck stiffness. Negative for weakness, altered level of consciousness , altered mental status, extremity weakness, paresthesias, involuntary movement, seizure and syncope.     Allergies  Cephalosporins and Penicillins  Home Medications   Current Outpatient Rx  Name Route Sig Dispense Refill  . ALBUTEROL SULFATE HFA 108 (90 BASE) MCG/ACT IN AERS Inhalation Inhale 2 puffs into the lungs every 6 (six) hours as needed. For wheezing    . LANSOPRAZOLE 30 MG PO CPDR Oral Take 30 mg by mouth daily as needed. For heartburn    . NYSTATIN 100000 UNIT/ML MT SUSP Oral Take 500,000 Units by mouth 4 (four) times daily.      BP 133/89  Pulse 110  Temp 98.6 F (37 C)  Resp 17  SpO2 97%  Physical Exam 1650: Physical examination:  Nursing notes reviewed; Vital signs and O2 SAT reviewed;  Constitutional: Well developed, Well nourished, Well hydrated, In no acute distress; Head:  Normocephalic, atraumatic; Eyes: EOMI, PERRL, No scleral icterus; ENMT: Mouth and pharynx normal, Mucous membranes moist; Neck: Supple, Full range of motion, No lymphadenopathy; Cardiovascular: Regular rate and rhythm, No murmur, rub, or gallop; Respiratory: Breath sounds clear & equal bilaterally, No rales, rhonchi, wheezes, or rub, Normal respiratory effort/excursion; Chest: Nontender, Movement  normal; Abdomen: Soft, Nontender, Nondistended, Normal bowel sounds; Extremities:  +approx 1cm diameter area of erythema with central open area spontaneously expressing a scant amount of serosanguinous drainage at left dorsal thumb MP area, with localized edema, and red streaking up dorsal hand, forearm, to upper arm.  +TTP left thumb extensor tendon.  Full AROM and PROM with pt c/o  pain.  NT left thumb flexor tendon.  Strong radial pulse, brisk cap refill in fingertip. No deformity.; Neuro: AA&Ox3, Major CN grossly intact. Gait steady.  No gross focal motor or sensory deficits in extremities.; Skin: Color normal, Warm, Dry.   ED Course  Procedures   MDM  MDM Reviewed: nursing note and vitals Interpretation: x-ray and labs     Dg Finger Thumb Left 10/13/2011  *RADIOLOGY REPORT*  Clinical Data: Abscess, cellulitis  LEFT THUMB 2+V  Comparison: None.  Findings: Three views of the left thumb submitted.  No acute fracture or subluxation.  No periosteal reaction or bony erosion. Soft tissue swelling is noted adjacent to metacarpal phalangeal joint.  No radiopaque foreign body.  IMPRESSION: No acute fracture or subluxation.  Soft tissue swelling adjacent to metacarpal phalangeal joint.  No radiopaque foreign body.  No periosteal reaction or bony erosion.  Original Report Authenticated By: Natasha Mead, M.D.     Amos.Dom:  Pt has been NPO since approx 0830 this morning.  Afebrile here.  Concern for what appears to be extensor tenosynovitis with lymphangitis.  Will keep NPO, have ED RN draw labs, start IV vancomycin.  Dx and testing d/w pt and family.  Questions answered.  Verb understanding, agreeable to eval by Hand Surgeon and likely admit.  T/C to Hand Surgeon Dr. Betha Loa, case discussed, including:  HPI, pertinent PM/SHx, VS/PE, dx testing, ED course and treatment:  Agreeable to come to ED for eval.    1810:  Dr Merlyn Lot has eval pt in ED.  Will take pt to OR to drain.           I personally performed the services described in this documentation, which was scribed in my presence. The recorded information has been reviewed and considered. Myangel Summons Allison Quarry, DO 10/14/11 2051

## 2011-10-13 NOTE — Anesthesia Postprocedure Evaluation (Signed)
  Anesthesia Post-op Note  Patient: Bruce Vaughn  Procedure(s) Performed: Procedure(s) (LRB): IRRIGATION AND DEBRIDEMENT EXTREMITY (Left)  Patient Location: PACU  Anesthesia Type: General  Level of Consciousness: awake  Airway and Oxygen Therapy: Patient Spontanous Breathing and Patient connected to nasal cannula oxygen  Post-op Pain: none  Post-op Assessment: Post-op Vital signs reviewed, Patient's Cardiovascular Status Stable, Respiratory Function Stable, Patent Airway and No signs of Nausea or vomiting  Post-op Vital Signs: Reviewed and stable  Complications: No apparent anesthesia complications

## 2011-10-13 NOTE — ED Notes (Signed)
Pt with abscess to anterior aspect of left thumb, noticed this am, pt states has had drainage reports area has increased, streaking noted up left wrist.

## 2011-10-13 NOTE — Op Note (Signed)
Dictation [phone]

## 2011-10-13 NOTE — Anesthesia Preprocedure Evaluation (Addendum)
Anesthesia Evaluation  Patient identified by MRN, date of birth, ID band Patient awake    Reviewed: Allergy & Precautions, H&P , NPO status , Patient's Chart, lab work & pertinent test results  Airway Mallampati: III TM Distance: >3 FB Neck ROM: Full    Dental No notable dental hx. (+) Teeth Intact and Dental Advisory Given   Pulmonary asthma , Current Smoker,  breath sounds clear to auscultation  Pulmonary exam normal       Cardiovascular negative cardio ROS  Rhythm:Regular Rate:Normal     Neuro/Psych negative neurological ROS  negative psych ROS   GI/Hepatic negative GI ROS, Neg liver ROS, GERD-  ,GERD on "occasion" "not recently"   Endo/Other  negative endocrine ROS  Renal/GU negative Renal ROS  negative genitourinary   Musculoskeletal   Abdominal   Peds  Hematology negative hematology ROS (+)   Anesthesia Other Findings   Reproductive/Obstetrics negative OB ROS                          Anesthesia Physical Anesthesia Plan  ASA: II  Anesthesia Plan: General   Post-op Pain Management:    Induction: Intravenous  Airway Management Planned: LMA  Additional Equipment:   Intra-op Plan:   Post-operative Plan: Extubation in OR  Informed Consent: I have reviewed the patients History and Physical, chart, labs and discussed the procedure including the risks, benefits and alternatives for the proposed anesthesia with the patient or authorized representative who has indicated his/her understanding and acceptance.   Dental advisory given  Plan Discussed with: CRNA  Anesthesia Plan Comments:         Anesthesia Quick Evaluation

## 2011-10-13 NOTE — Discharge Instructions (Signed)

## 2011-10-13 NOTE — H&P (Signed)
Bruce Vaughn is an 41 y.o. male.   Chief Complaint: left thumb infection HPI: 41 yo rhd male with left thumb swelling, erythema, and pain since this AM.  No problems when went to bed last night.  Pain has worsened during the day.  Doesn't remember any wounds or injuries to thumb.  No previous problems and no other problems at this time.  Works on pools and pulls liners away frequently.  Thinks he could have been injured or bitten while doing this but doesn't remember a specific time.  No fevers, chills, night sweats.  Past Medical History  Diagnosis Date  . Asthma     History reviewed. No pertinent past surgical history.  History reviewed. No pertinent family history. Social History:  reports that he has been smoking.  He does not have any smokeless tobacco history on file. He reports that he drinks alcohol. He reports that he does not use illicit drugs.  Allergies:  Allergies  Allergen Reactions  . Cephalosporins Other (See Comments)    Bumps  . Penicillins Rash    Can take amoxicillin     (Not in a hospital admission)  Results for orders placed during the hospital encounter of 10/13/11 (from the past 48 hour(s))  BASIC METABOLIC PANEL     Status: Normal   Collection Time   10/13/11  5:18 PM      Component Value Range Comment   Sodium 137  135 - 145 (mEq/L)    Potassium 3.6  3.5 - 5.1 (mEq/L)    Chloride 102  96 - 112 (mEq/L)    CO2 23  19 - 32 (mEq/L)    Glucose, Bld 95  70 - 99 (mg/dL)    BUN 17  6 - 23 (mg/dL)    Creatinine, Ser 1.61  0.50 - 1.35 (mg/dL)    Calcium 9.2  8.4 - 10.5 (mg/dL)    GFR calc non Af Amer >90  >90 (mL/min)    GFR calc Af Amer >90  >90 (mL/min)   CBC     Status: Abnormal (Preliminary result)   Collection Time   10/13/11  5:18 PM      Component Value Range Comment   WBC PENDING  4.0 - 10.5 (K/uL)    RBC 5.30  4.22 - 5.81 (MIL/uL)    Hemoglobin 14.2  13.0 - 17.0 (g/dL)    HCT 09.6  04.5 - 40.9 (%)    MCV 77.5 (*) 78.0 - 100.0 (fL)    MCH  26.8  26.0 - 34.0 (pg)    MCHC 34.5  30.0 - 36.0 (g/dL)    RDW 81.1  91.4 - 78.2 (%)    Platelets 205  150 - 400 (K/uL)   DIFFERENTIAL     Status: Normal (Preliminary result)   Collection Time   10/13/11  5:18 PM      Component Value Range Comment   Neutrophils Relative PENDING  43 - 77 (%)    Neutro Abs PENDING  1.7 - 7.7 (K/uL)    Band Neutrophils PENDING  0 - 10 (%)    Lymphocytes Relative PENDING  12 - 46 (%)    Lymphs Abs PENDING  0.7 - 4.0 (K/uL)    Monocytes Relative PENDING  3 - 12 (%)    Monocytes Absolute PENDING  0.1 - 1.0 (K/uL)    Eosinophils Relative PENDING  0 - 5 (%)    Eosinophils Absolute PENDING  0.0 - 0.7 (K/uL)    Basophils Relative  PENDING  0 - 1 (%)    Basophils Absolute PENDING  0.0 - 0.1 (K/uL)    WBC Morphology PENDING      RBC Morphology PENDING      Smear Review PENDING      nRBC PENDING  0 (/100 WBC)    Metamyelocytes Relative PENDING      Myelocytes PENDING      Promyelocytes Absolute PENDING      Blasts PENDING     PROTIME-INR     Status: Normal   Collection Time   10/13/11  5:18 PM      Component Value Range Comment   Prothrombin Time 12.9  11.6 - 15.2 (seconds)    INR 0.95  0.00 - 1.49    APTT     Status: Abnormal   Collection Time   10/13/11  5:18 PM      Component Value Range Comment   aPTT 41 (*) 24 - 37 (seconds)     Dg Finger Thumb Left  10/13/2011  *RADIOLOGY REPORT*  Clinical Data: Abscess, cellulitis  LEFT THUMB 2+V  Comparison: None.  Findings: Three views of the left thumb submitted.  No acute fracture or subluxation.  No periosteal reaction or bony erosion. Soft tissue swelling is noted adjacent to metacarpal phalangeal joint.  No radiopaque foreign body.  IMPRESSION: No acute fracture or subluxation.  Soft tissue swelling adjacent to metacarpal phalangeal joint.  No radiopaque foreign body.  No periosteal reaction or bony erosion.  Original Report Authenticated By: Natasha Mead, M.D.     A comprehensive review of systems was  negative.  Blood pressure 135/78, pulse 94, temperature 98.4 F (36.9 C), temperature source Oral, resp. rate 18, SpO2 96.00%.  General appearance: alert, cooperative and appears stated age Head: Normocephalic, without obvious abnormality, atraumatic Neck: supple, symmetrical, trachea midline Resp: clear to auscultation bilaterally Cardio: regular rate and rhythm GI: soft, non-tender; bowel sounds normal; no masses,  no organomegaly Extremities: light touch sensation and capillary refill intact all digits.  +epl/fpl/io.  right ue without wounds or ttp.  left hand ttp at thumb mp joint mostly dorsally.  small reddened area with surrounding fluctuance and tenderness.  no tenderness at pad/ip or along flexor tendon.  streak running from dorsum of thumb up forearm. able to move thumb but painful. Pulses: 2+ and symmetric Skin: as above Neurologic: Grossly normal Incision/Wound: As above  Assessment/Plan Left thumb infection.  Recommend OR for I&D of thumb possibly to include mp joint.  Risks, benefits, and alternatives of surgery were discussed and the patient agrees with the plan of care.   [name] 10/13/2011, 6:16 PM

## 2011-10-14 NOTE — Op Note (Signed)
Bruce, Vaughn              ACCOUNT NO.:  [id number]  MEDICAL RECORD NO.:  [id number]  LOCATION:  [facility]                         FACILITY:  [facility]  PHYSICIAN:  Betha Loa, MD        DATE OF BIRTH:  02/07/1971  DATE OF PROCEDURE:  10/13/2011 DATE OF DISCHARGE:  10/13/2011                              OPERATIVE REPORT   PREOPERATIVE DIAGNOSIS:  Left thumb infection.  PREOPERATIVE DIAGNOSIS:  Left thumb infection.  PROCEDURE:  Irrigation and debridement of left thumb subcutaneous abscess and MP joint.  SURGEON:  Betha Loa, MD  ASSISTANT:  None.  ANESTHESIA:  General.  IV FLUIDS:  Per anesthesia flow sheet.  ESTIMATED BLOOD LOSS:  Minimal.  COMPLICATIONS:  None.  SPECIMENS:  Cultures to micro.  TOURNIQUET TIME:  19 minutes.  DISPOSITION:  Stable to PACU.  INDICATIONS:  [name] is a 41 year old right-hand-dominant male who this morning started noticing erythema, swelling, and pain of his left thumb.  He notes an area of erythema on the dorsum of the thumb that is extremely tender.  On examination, he had intact sensation, capillary refill on the thumb.  He was tender at the MP joint and directly over the area of erythema.  He had a streak going up the dorsum of his hand and into his forearm up to the elbow.  I discussed with Mr. Saefong the nature of the infection.  I recommended going to the operating room for irrigation and debridement of the thumb.  Risks, benefits and alternatives of the surgery were discussed including the risk of blood loss; infection; damage to nerves, vessels, tendons, ligaments, bone; failure of surgery; need for additional surgery; complications; wound healing; continued pain; continued infection; need for repeat irrigation and debridement.  He voiced understanding of these risks and elected to proceed.  OPERATIVE COURSE:  After being identified preoperatively by myself, the patient and I agreed upon the procedure and site of  procedure.  The surgical site was marked.  The risks, benefits, and alternatives of the surgery were reviewed and he wished to proceed.  Surgical consent had been signed.  He had been given vancomycin in the emergency department. He was transported to the operating room and placed in the operating room table in supine position with left upper extremity on an armboard. General anesthesia was induced by the anesthesiologist.  The left upper extremity was prepped and draped in normal sterile orthopedic fashion. Surgical pause was performed between the surgeons, anesthesia, and operating room staff, and all were in agreement as to the patient procedure and site of procedure.  Tourniquet at the proximal aspect of the extremity was inflated to 250 mmHg after exsanguination of the forearm with an Esmarch bandage.  An incision was made over the dorsum of the thumb centered over the MP joint.  This was carried into the subcutaneous tissues.  There was significant thin fluid within the subcutaneous tissues.  The skin in the area of the erythema was thickened and reddened.  This portion was excised.  The wound was copiously irrigated with sterile saline.  The area underneath the tendons at the MP joint seemed very fluid and boggy.  The MP joint  was entered between the EPL and EPB tendons.  There was significant amount of synovial fluid, but not purulence in the joint.  The joint was copiously irrigated with 200 mL of sterile saline by Angiocath needle. The wound was then copiously irrigated all together with 1000 mL of sterile saline.  A vessel loop drain was placed in the MP joint and the wound was packed with 0.25-inch iodoform gauze.  It was injected with 4 mL of 0.25% plain Marcaine to aid in postoperative analgesia.  It was dressed with sterile Xeroform, 4x4s, and wrapped with a Kerlix bandage. A thumb spica splint was placed and wrapped with Kerlix and an Ace bandage.  Tourniquet was deflated  at 19 minutes.  The thumb tip was pink with brisk capillary refill after deflation of the tourniquet.  Cultures had been taken of the thin watery fluid in the subcutaneous tissues and sent to micro.  The patient was awakened from anesthesia safely.  He was transferred back to the stretcher and taken to PACU in stable condition. I will see him back in the office on Monday for postprocedure followup. I will give him Percocet 5/325 1-2 p.o. q.6 hours p.r.n. pain, dispensed #30 and Bactrim DS 1 p.o. b.i.d. x7 days.     Betha Loa, MD     KK/MEDQ  D:  10/13/2011  T:  10/14/2011  Job:  [id number]

## 2011-10-16 ENCOUNTER — Encounter (HOSPITAL_COMMUNITY): Payer: Self-pay | Admitting: Orthopedic Surgery

## 2011-10-17 LAB — CULTURE, ROUTINE-ABSCESS

## 2011-10-18 LAB — ANAEROBIC CULTURE: Gram Stain: NONE SEEN

## 2011-10-23 ENCOUNTER — Emergency Department (HOSPITAL_COMMUNITY)
Admission: EM | Admit: 2011-10-23 | Discharge: 2011-10-23 | Disposition: A | Discharge status: Home / Self Care | Payer: Self-pay | Attending: Emergency Medicine | Admitting: Emergency Medicine

## 2011-10-23 ENCOUNTER — Encounter (HOSPITAL_COMMUNITY): Payer: Self-pay | Admitting: *Deleted

## 2011-10-23 DIAGNOSIS — L270 Generalized skin eruption due to drugs and medicaments taken internally: Secondary | ICD-10-CM | POA: Insufficient documentation

## 2011-10-23 DIAGNOSIS — I10 Essential (primary) hypertension: Secondary | ICD-10-CM

## 2011-10-23 DIAGNOSIS — J45909 Unspecified asthma, uncomplicated: Secondary | ICD-10-CM | POA: Insufficient documentation

## 2011-10-23 DIAGNOSIS — F17200 Nicotine dependence, unspecified, uncomplicated: Secondary | ICD-10-CM | POA: Insufficient documentation

## 2011-10-23 MED ORDER — PREDNISONE 10 MG PO TABS: 10.0000 mg | ORAL_TABLET | Freq: Every day | ORAL | Status: DC

## 2011-10-23 MED ORDER — SODIUM CHLORIDE 0.9 % IV SOLN
Freq: Once | INTRAVENOUS | Status: AC
  Administered 2011-10-23: 14:00:00 via INTRAVENOUS

## 2011-10-23 MED ORDER — CLINDAMYCIN PHOSPHATE 600 MG/50ML IV SOLN
600.0000 mg | Freq: Once | INTRAVENOUS | Status: AC
  Administered 2011-10-23: 600 mg via INTRAVENOUS
  Filled 2011-10-23: qty 50

## 2011-10-23 MED ORDER — AMOXICILLIN-POT CLAVULANATE 500-125 MG PO TABS: 1.0000 | ORAL_TABLET | Freq: Three times a day (TID) | ORAL | Status: DC

## 2011-10-23 NOTE — ED Provider Notes (Signed)
History     CSN: [id number]  Arrival date & time 10/23/11  1027   First MD Initiated Contact with Patient 10/23/11 1126      Chief Complaint  Patient presents with  . Rash  . Allergic Reaction    (Consider location/radiation/quality/duration/timing/severity/associated sxs/prior treatment) HPI  Pt sent down by hand doctor for evaluation of rash. The patient had surgery to hand 10 days ago after his thumb getting infected. He has been on Bactrim for the past 10 days. Today he went to his follow-up appointment and showed the Doctor a rash that he has developed over his left tricep and left flank. He says that the rash isnt itchy but it is painful. He denies having fevers, chills nausea, vomiting, diarrhea, or weakness.  Past Medical History  Diagnosis Date  . Asthma     Past Surgical History  Procedure Date  . I&d extremity 10/13/2011    Procedure: IRRIGATION AND DEBRIDEMENT EXTREMITY;  Surgeon: Tami Ribas, MD;  Location: [facility] OR;  Service: Orthopedics;  Laterality: Left;    History reviewed. No pertinent family history.  History  Substance Use Topics  . Smoking status: Current Everyday Smoker  . Smokeless tobacco: Not on file  . Alcohol Use: Yes      Review of Systems   HEENT: denies blurry vision or change in hearing PULMONARY: Denies difficulty breathing and SOB CARDIAC: denies chest pain or heart palpitations MUSCULOSKELETAL:  denies being unable to ambulate ABDOMEN AL: denies abdominal pain GU: denies loss of bowel or urinary control NEURO: denies numbness and tingling in extremities   Allergies  Cephalosporins; Bactrim; and Penicillins  Home Medications   Current Outpatient Rx  Name Route Sig Dispense Refill  . ALBUTEROL SULFATE HFA 108 (90 BASE) MCG/ACT IN AERS Inhalation Inhale 2 puffs into the lungs every 6 (six) hours as needed. For wheezing    . OXYCODONE-ACETAMINOPHEN 5-325 MG PO TABS Oral Take 1-2 tablets by mouth every 6 (six) hours as needed.  For pain    . SULFAMETHOXAZOLE-TMP DS 800-160 MG PO TABS Oral Take 1 tablet by mouth 2 (two) times daily. For 10 days, started on 5-10    . AMOXICILLIN-POT CLAVULANATE 500-125 MG PO TABS Oral Take 1 tablet (500 mg total) by mouth every 8 (eight) hours. 21 tablet 0  . PREDNISONE 10 MG PO TABS Oral Take 1 tablet (10 mg total) by mouth daily. 21 tablet 0    Take 6 tabs on day 1, 5 tabs on day 2, 4 tabs on d ...    BP 123/84  Pulse 73  Temp(Src) 98.2 F (36.8 C) (Oral)  Resp 16  SpO2 100%  Physical Exam  Nursing note and vitals reviewed. Constitutional: He appears well-developed and well-nourished. No distress.  HENT:  Head: Normocephalic and atraumatic.  Eyes: Pupils are equal, round, and reactive to light.  Neck: Normal range of motion. Neck supple.  Cardiovascular: Normal rate and regular rhythm.   Pulmonary/Chest: Effort normal.  Abdominal: Soft.  Neurological: He is alert.  Skin: Skin is warm and dry.          Pt has a pupura lesion with irregular shaped borders surrounded by a circular shape of erythema that is mildly indurated to left flank.   To left tricep patient has multiple erythematous lesions with out purpura with are indurated and painful     ED Course  Procedures (including critical care time)  Labs Reviewed - No data to display No results found.  1. Drug rash       MDM  I have consulted I and Dr. Ninetta Lights to see patient in ED.   Dr. Ninetta Lights has evaluated patient and is not sure what the rash on his abdomen is. He does feel that the rash to his arm is most likely due to medication allergy from the bactrim. He is recommending the patient be placed on antibiotics (Augmentin) and a prednisone dose pack.  He will require close follow-up with IandD.  Pt has been advised of the symptoms that warrant their return to the ED. Patient has voiced understanding and has agreed to follow-up with the PCP or specialist.         Dorthula Matas, PA 10/23/11  [phone]

## 2011-10-23 NOTE — Discharge Instructions (Signed)
Drug Rash Skin reactions can be caused by several different drugs. Allergy to the medicine can cause itching, hives, and other rashes. Sun exposure causes a red rash with some medicines. Mononucleosis virus can cause a similar red rash when you are taking antibiotics. Sometimes, the rash may be accompanied by pain. The drug rash may happen with new drugs or with medicines that you have been taking for a while. The rash cannot be spread from person to person. In most cases, the symptoms of a drug rash are gone within a few days of stopping the medicine. Your rash, including hives (urticaria), is most likely from the following medicines:  Antibiotics or antimicrobials.   Anticonvulsants or seizure medicines.   Antihypertensives or blood pressure medicines.   Antimalarials.   Antidepressants or depression medicines.   Antianxiety drugs.   Diuretics or water pills.   Nonsteroidal anti-inflammatory drugs.   Simvastatin.   Lithium.   Omeprazole.   Allopurinol.   Pseudoephedrine.   Amiodarone.   Packed red blood cells, when you get a blood transfusion.   Contrast media, such as when getting an imaging test (CT or CAT scan).  This drug list is not all inclusive, but drug rashes have been reported with all the medicines listed above.Your caregiver will tell you which medicines to avoid. If you react to a medicine, a similar or worse reaction can occur the next time you take it. If you need to stop taking an antibiotic because of a drug rash, an alternative antibiotic may be needed to get rid of your infection. Antihistamine or cortisone drugs may be prescribed to help relieve your symptoms. Stay out of the sun until the rash is completely gone.  Be sure to let your caregiver know about your drug reaction. Do not take this medicine in the future. Call your caregiver if your drug rash does not improve within 3 to 4 days. SEEK IMMEDIATE MEDICAL CARE IF:   You develop breathing problems,  swelling in the throat, or wheezing.   You have weakness, fainting, fever, and muscle or joint pains.   You develop blisters or peeling of skin, especially around the mouth.  Document Released: 06/29/2004 Document Revised: 05/11/2011 Document Reviewed: 04/09/2008 [employer] Patient Information 2012 [employer]. 

## 2011-10-23 NOTE — ED Notes (Signed)
Pt. reports itching. Denies pain. A.O. x 4 NAD D/C home. Medications in hand.

## 2011-10-23 NOTE — Consult Note (Addendum)
Date of Admission:  10/23/2011  Date of Consult:  10/23/2011  Reason for Consult:Adverse Drug Reaction Referring Physician: Sypher  Impression/Recommendation Adverse Drug Reaction Would- start steroids.   Start pt on augmentin.  He needs f/u in next 24 hours.  Comment- his picture does not fit "stevens-johnson" but does have several concerning features- his "purpura", his dysphagia. Would treat him with a tapering steroid pack, change his anbx to something which he does not have an allergic history (his history lists that he can take amoxil).  He needs very close follow up.   Bruce Vaughn is an 41 y.o. male.  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 Over the next 4 hours he developed erythema up his arm and he was seen in ED. He underwent I & D and was sent home with septra. His Cx grew multiple organisms, no staph or strep. After the firs day of being on anbx he developed a sore mouth, and has since developed erythematous lesions on his arms, trunk.   Past Medical History  Diagnosis Date  . Asthma     Past Surgical History  Procedure Date  . I&d extremity 10/13/2011    Procedure: IRRIGATION AND DEBRIDEMENT EXTREMITY;  Surgeon: Tami Ribas, MD;  Location: [facility] OR;  Service: Orthopedics;  Laterality: Left;  ergies:   Allergies  Allergen Reactions  . Cephalosporins Other (See Comments)    Bumps  . Bactrim (Sulfamethoxazole-Tmp Ds) Swelling and Rash    Numbness and tingling in tounge  . Penicillins Rash    Can take amoxicillin    Medications:  Scheduled:   . sodium chloride   Intravenous Once  . clindamycin (CLEOCIN) IV  600 mg Intravenous Once    Social History:  reports that he has been smoking.  He does not have any smokeless tobacco history on file. He reports that he drinks alcohol. He reports that he does not use illicit drugs.  History reviewed. No pertinent family history.  General ROS:  +dysphagia, no dysuria, normal BM, no lymphadenopathy, no fever/chills, no headache, no vision changes.   Blood pressure 123/84, pulse 73, temperature 98.2 F (36.8 C), temperature source Oral, resp. rate 16, SpO2 100.00%. General appearance: alert, cooperative and no distress Eyes: negative findings: conjunctivae and sclerae normal and pupils equal, round, reactive to light and accomodation Throat: he has mild erythema in his pharynx, no blisters or ulcers.  Neck: no carotid bruit and supple, symmetrical, trachea midline Lungs: clear to auscultation bilaterally Heart: regular rate and rhythm Abdomen: normal findings: bowel sounds normal and soft, non-tender Skin: multiple areas of urticaria on trunk arms. he has a single lision on his L flank which has bleeding within the lesion. his lesions are not raised.  Lymph nodes: Cervical, supraclavicular, and axillary nodes normal. L thumb is clean, non-fluctuant. No d/c. No erythema.   No results found for this or any previous visit (from the past 48 hour(s)).    Component Value Date/Time   SDES ABSCESS HAND LEFT 10/13/2011 1933   SDES ABSCESS HAND LEFT 10/13/2011 1933   SPECREQUEST PATIENT ON FOLLOWING VANCOMYCIN,KEFLEX 10/13/2011 1933   SPECREQUEST PATIENT ON FOLLOWING VANCOMYCIN,KEFLEX 10/13/2011 1933   CULT  Value: MULTIPLE ORGANISMS PRESENT, NONE PREDOMINANT Note: NO STAPHYLOCOCCUS AUREUS ISOLATED NO GROUP A STREP (S.PYOGENES) ISOLATED 10/13/2011 1933   CULT NO ANAEROBES ISOLATED 10/13/2011 1933   REPTSTATUS 10/17/2011 FINAL 10/13/2011 1933   REPTSTATUS 10/18/2011  FINAL 10/13/2011 1933   No results found.  Thank you so much for this interesting consult,   Bruce Vaughn [phone] 10/23/2011, 2:47 PM     LOS: 0 days

## 2011-10-23 NOTE — ED Provider Notes (Signed)
Medical screening examination/treatment/procedure(s) were conducted as a shared visit with non-physician practitioner(s) and myself.  I personally evaluated the patient during the encounter  Pt with rash to left trunk, left upper extremity.  Pt recently with abscess and cellulitis of thumb extending to hand, forearm treated by Dr. Merlyn Lot with an I&D on 5/10.  Dr. Merlyn Lot sent to the ED to see ID physician.  Dr. Ninetta Lights saw pt, recommends continued abx and steroids for possibly allergy.  Pt knows needs a PCP.    Bruce Pound. Grayer Sproles, MD 10/23/11 1510

## 2011-10-23 NOTE — ED Notes (Signed)
Pt. Reports reaching to antibiotics. Itching, rash, discomfort.

## 2011-10-23 NOTE — ED Notes (Signed)
Sent to ED for eval of rash and possible allergic rx to sulfa abx that pt was started on after thumb surgery 10 days ago due to infection. Rash on pt's side has a dark area on middle. Pt is to see ID but rash is getting worse

## 2011-10-31 ENCOUNTER — Telehealth: Payer: Self-pay | Admitting: *Deleted

## 2011-10-31 NOTE — Telephone Encounter (Signed)
He had an infection of his L thumb. (see operative notes)  Every antibiotic he was given caused a rash & itching. He has now stopped all antibiotics. He is not taking anything for the rash. He describes it a a red itchy rash all over. C/o black spots on his ears. States this has happened before & he has a lot of allergies. Denies fever, swelling or breathing issues. Endorses joints pain "all over" States he has his dtr's epi pen handy in case he needs it. She has the same issues.   He wants to be seen here. I asked him to call the md who told him he should be seen here & we can start the process to get him seen. He has no insurance. His cell is [phone] & home is [phone]

## 2011-11-01 ENCOUNTER — Inpatient Hospital Stay (HOSPITAL_COMMUNITY)
Admission: EM | Admit: 2011-11-01 | Discharge: 2011-11-03 | DRG: 813 | Disposition: A | Discharge status: Home / Self Care | Payer: MEDICAID | Source: Ambulatory Visit | Attending: Internal Medicine | Admitting: Internal Medicine

## 2011-11-01 ENCOUNTER — Encounter (HOSPITAL_COMMUNITY): Payer: Self-pay | Admitting: Emergency Medicine

## 2011-11-01 DIAGNOSIS — K219 Gastro-esophageal reflux disease without esophagitis: Secondary | ICD-10-CM

## 2011-11-01 DIAGNOSIS — D692 Other nonthrombocytopenic purpura: Secondary | ICD-10-CM

## 2011-11-01 DIAGNOSIS — R238 Other skin changes: Secondary | ICD-10-CM

## 2011-11-01 DIAGNOSIS — R21 Rash and other nonspecific skin eruption: Secondary | ICD-10-CM

## 2011-11-01 DIAGNOSIS — Z888 Allergy status to other drugs, medicaments and biological substances status: Secondary | ICD-10-CM

## 2011-11-01 DIAGNOSIS — F1410 Cocaine abuse, uncomplicated: Secondary | ICD-10-CM | POA: Diagnosis present

## 2011-11-01 DIAGNOSIS — F17200 Nicotine dependence, unspecified, uncomplicated: Secondary | ICD-10-CM | POA: Diagnosis present

## 2011-11-01 DIAGNOSIS — M79609 Pain in unspecified limb: Secondary | ICD-10-CM

## 2011-11-01 DIAGNOSIS — L988 Other specified disorders of the skin and subcutaneous tissue: Secondary | ICD-10-CM

## 2011-11-01 DIAGNOSIS — J45909 Unspecified asthma, uncomplicated: Secondary | ICD-10-CM | POA: Diagnosis present

## 2011-11-01 LAB — DIFFERENTIAL
Eosinophils Absolute: 0.1 10*3/uL (ref 0.0–0.7)
Lymphs Abs: 2.7 10*3/uL (ref 0.7–4.0)
Monocytes Absolute: 0.8 10*3/uL (ref 0.1–1.0)
Monocytes Relative: 16 % — ABNORMAL HIGH (ref 3–12)
Neutrophils Relative %: 32 % — ABNORMAL LOW (ref 43–77)

## 2011-11-01 LAB — CBC
HCT: 39.7 % (ref 39.0–52.0)
Hemoglobin: 13.5 g/dL (ref 13.0–17.0)
MCH: 26.2 pg (ref 26.0–34.0)
RBC: 5.16 MIL/uL (ref 4.22–5.81)

## 2011-11-01 LAB — RAPID URINE DRUG SCREEN, HOSP PERFORMED
Barbiturates: NOT DETECTED
Benzodiazepines: NOT DETECTED
Cocaine: POSITIVE — AB
Tetrahydrocannabinol: POSITIVE — AB

## 2011-11-01 LAB — BASIC METABOLIC PANEL
BUN: 14 mg/dL (ref 6–23)
Chloride: 101 mEq/L (ref 96–112)
GFR calc non Af Amer: >90 mL/min (ref >90)
Glucose, Bld: 103 mg/dL — ABNORMAL HIGH (ref 70–99)
Potassium: 3.8 mEq/L (ref 3.5–5.1)

## 2011-11-01 LAB — APTT: aPTT: 37 seconds (ref 24–37)

## 2011-11-01 LAB — URINALYSIS, ROUTINE W REFLEX MICROSCOPIC
Bilirubin Urine: NEGATIVE
Hgb urine dipstick: NEGATIVE
Specific Gravity, Urine: 1.025 (ref 1.005–1.030)
pH: 6 (ref 5.0–8.0)

## 2011-11-01 MED ORDER — POLYETHYLENE GLYCOL 3350 17 G PO PACK
17.0000 g | PACK | Freq: Every day | ORAL | Status: DC | PRN
  Filled 2011-11-01: qty 1

## 2011-11-01 MED ORDER — DOCUSATE SODIUM 100 MG PO CAPS
100.0000 mg | ORAL_CAPSULE | Freq: Two times a day (BID) | ORAL | Status: DC
  Administered 2011-11-01 – 2011-11-03 (×4): 100 mg via ORAL
  Filled 2011-11-01 (×4): qty 1

## 2011-11-01 MED ORDER — ALBUTEROL SULFATE HFA 108 (90 BASE) MCG/ACT IN AERS
2.0000 | INHALATION_SPRAY | Freq: Four times a day (QID) | RESPIRATORY_TRACT | Status: DC | PRN
  Filled 2011-11-01: qty 6.7

## 2011-11-01 MED ORDER — ACETAMINOPHEN 325 MG PO TABS: 650.0000 mg | ORAL_TABLET | Freq: Four times a day (QID) | ORAL | Status: DC | PRN

## 2011-11-01 MED ORDER — HYDROMORPHONE HCL PF 1 MG/ML IJ SOLN
1.0000 mg | Freq: Once | INTRAMUSCULAR | Status: AC
  Administered 2011-11-01: 1 mg via INTRAMUSCULAR
  Filled 2011-11-01: qty 1

## 2011-11-01 MED ORDER — ONDANSETRON 4 MG PO TBDP
4.0000 mg | ORAL_TABLET | Freq: Once | ORAL | Status: AC
  Administered 2011-11-01: 4 mg via ORAL
  Filled 2011-11-01: qty 1

## 2011-11-01 MED ORDER — ONDANSETRON HCL 4 MG/2ML IJ SOLN
4.0000 mg | Freq: Once | INTRAMUSCULAR | Status: AC
  Administered 2011-11-01: 4 mg via INTRAVENOUS
  Filled 2011-11-01: qty 2

## 2011-11-01 MED ORDER — HYDROMORPHONE HCL PF 2 MG/ML IJ SOLN
2.0000 mg | Freq: Once | INTRAMUSCULAR | Status: AC
  Administered 2011-11-01: 2 mg via INTRAVENOUS
  Filled 2011-11-01: qty 1

## 2011-11-01 MED ORDER — PROMETHAZINE HCL 25 MG PO TABS: 12.5000 mg | ORAL_TABLET | Freq: Four times a day (QID) | ORAL | Status: DC | PRN

## 2011-11-01 MED ORDER — OXYCODONE HCL 5 MG PO TABS
5.0000 mg | ORAL_TABLET | ORAL | Status: DC | PRN
  Administered 2011-11-01 – 2011-11-02 (×5): 5 mg via ORAL
  Filled 2011-11-01 (×5): qty 1

## 2011-11-01 MED ORDER — TRAZODONE HCL 50 MG PO TABS
50.0000 mg | ORAL_TABLET | Freq: Every evening | ORAL | Status: DC | PRN
  Filled 2011-11-01: qty 1

## 2011-11-01 MED ORDER — ACETAMINOPHEN 650 MG RE SUPP: 650.0000 mg | Freq: Four times a day (QID) | RECTAL | Status: DC | PRN

## 2011-11-01 NOTE — ED Notes (Addendum)
Report called to [facility] on 3000

## 2011-11-01 NOTE — H&P (Signed)
Medical Student Hospital Admission Note Date: 11/01/2011  Patient name: Bruce Vaughn Medical record number: [id number] Date of birth: Aug 11, 1970 Age: 41 y.o. Gender: male PCP: DEFAULT,PROVIDER, MD, MD  Medical Service: Internal Medicine Teaching Service  Attending physician: Dr. 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 He underwent EGD in 05/2009 for odynophagia that had revealed the herpetic esophagitis. Otherwise, patient was in his usual state of health until about 4 weeks ago (10/03/11) when he fractured the proximal phalanx of the 5th digit on his right foot. He received medication to numb the area in order to reset the bone. On 10/05/11 he returned to the ED with redness, swelling, and increased tenderness to his right foot and was given percocet and cephalexin for his foot infection with good response. Then a few days later (10/09/11) he returned to the ED complaining of an allergic reaction to the cephalexin (Day 4) with white patches/sores on the bottom front gums, end of tongue and interior mouth. He reported dysphagia with this as well. ED notes that his foot cellulitis had cleared up and that the white plaques in his mouth were consistent with oral thrush and not an allergic reaction. He was given nystatin mouthwash for treatment however according to the patient he did not have any response to this. His symptoms gradually resolved with time nonetheless.  Recently, about 2 weeks ago (10/13/11) he developed tenderness, erythema, swelling, and pain on his left 1st metacarpal. He had a streak that travelled up the dorsum of his hand to his forearm up to the elbow. He underwent I&D of the thumb by Dr. Betha Loa. Diagnosis of tenosynovitis with lymphangitis and treated with vancomycin. He was discharged on percocet and Bactrim DS x 7  days. He returned again to the ED on 10/23/11 with itchy rash to his left flank and left tricep. Left flank ash described as painLESS purpura lesion with irregular shaped borders surrounded by a circular shape of erythema that is mildly indurated. Left tricep had multiple erythematous lesions witOUT purpura and indurated and painFUL. He was diagnosed with an allergy to Bactrim and was given Augmentin x 7 days and a prednisone taper x 21 days. His rash then began to spread to his B/L medial thighs, B/L lateral legs, and periumbilicus (approx. 6cm x 4cm). Plantar surface and medial malleolus of his right foot is also exquisitely tender to touch and he is unable to put any weight on his foot. He comes to the ED with these worsening symptoms for evaluation. He denies any fevers, chills, or night sweats.  Meds: None. Most recent rx 5/20: Augmentin 500/125 x 7 days, Prednisone 10 mg x 21 day taper  Allergies: Allergies as of 11/01/2011 - Review Complete 11/01/2011  Allergen Reaction Noted  . Cephalosporins Other (See Comments) 10/13/2011  . Amoxicillin Itching and Rash 11/01/2011  . Augmentin (amoxicillin-pot clavulanate) Itching and Rash 11/01/2011  . Bactrim (sulfamethoxazole-tmp ds) Swelling and Rash 10/23/2011  . Clindamycin/lincomycin Itching and Rash 11/01/2011  . Penicillins Rash   . Sulfa antibiotics Itching and Rash 11/01/2011   Past Medical History  Diagnosis Date  . Asthma    Past Surgical History  Procedure Date  . I&d extremity 10/13/2011    Procedure: IRRIGATION AND DEBRIDEMENT EXTREMITY;  Surgeon: Tami Ribas, MD;  Location: [facility]  OR;  Service: Orthopedics;  Laterality: Left;   Family History  Problem Relation Age of Onset  . Diabetes Mother   . Diabetes Maternal Grandmother   . Allergies Daughter     mold allergy, treatment x 1 year   History   Social History  . Marital Status: Single    Spouse Name: N/A    Number of Children: 5  . Years of Education: 11th grade    Occupational History  . 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 Smoking status: Current Everyday Smoker -- 1 - 2 packs/day for 26 years    Types: Cigarettes  . Smokeless tobacco: Not on file  . Alcohol Use:     .1 - 2 drinks per YEAR  . Drug Use: 1 per week    Special: Marijuana  . Sexually Active: Not on file   Other Topics Concern  . Not on file   Social History Narrative   Has  5 kids. Cleans pools. Daughter has chronic urticaria (?allergic to mold).     Review of Systems: Constitutional: positive for none, negative for anorexia, chills, fatigue, fevers, malaise, night sweats, sweats and weight loss Eyes: positive for none, negative for cataracts, color blindness, contacts/glasses, glaucoma, icterus, irritation, redness and visual disturbance Ears, nose, mouth, throat, and face: positive for none, negative for epistaxis, facial trauma, hearing loss, hoarseness, nasal congestion, snoring, sore mouth, sore throat, tinnitus and voice change Respiratory: positive for cough, negative for chronic bronchitis, dyspnea on exertion, emphysema, hemoptysis, pleurisy/chest pain, pneumonia, sputum, stridor and wheezing Cardiovascular: positive for none, negative for chest pain, chest pressure/discomfort, claudication, dyspnea, exertional chest pressure/discomfort, fatigue, irregular heart beat, lower extremity edema, near-syncope, orthopnea, palpitations, paroxysmal nocturnal dyspnea, syncope and tachypnea Gastrointestinal: positive for none, negative for abdominal pain, change in bowel habits, constipation, diarrhea, dyspepsia, dysphagia, jaundice, melena, nausea, odynophagia, reflux symptoms and vomiting Genitourinary:positive for none, negative for dysuria, frequency, hematuria, hesitancy, nocturia and urinary incontinence Integument/breast: positive for none, negative for changed mole and nipple discharge Hematologic/lymphatic:  positive for purpura, negative for bleeding and lymphadenopathy Musculoskeletal:positive for arthralgias and myalgias, negative for back pain, bone pain, muscle weakness and stiff joints Neurological: positive for none, negative for coordination problems, dizziness, gait problems, headaches, memory problems, paresthesia, seizures, speech problems, tremors, vertigo and weakness Behavioral/Psych: positive for none, negative for abusive relationship, aggressive behavior, anxiety, bad mood, decreased appetite, irritability, mood swings and sleep disturbance Endocrine: positive for none, negative for temperature intolerance Allergic/Immunologic: positive for urticaria, negative for anaphylaxis and angioedema  Physical Exam: Blood pressure 110/66, pulse 88, temperature 99.1 F (37.3 C), temperature source Oral, resp. rate 20, SpO2 99.00%. General appearance: alert, cooperative and no distress Head: Normocephalic, without obvious abnormality, atraumatic Eyes: conjunctivae/corneas clear. PERRL, EOM's intact. Fundi benign. Nose: Nares normal. Septum midline. Mucosa normal. No drainage or sinus tenderness. Throat: lips, mucosa, and tongue normal; teeth and gums normal Neck: no adenopathy, no carotid bruit, no JVD, supple, symmetrical, trachea midline and thyroid not enlarged, symmetric, no tenderness/mass/nodules Back: symmetric, no curvature. ROM normal. No CVA tenderness. Lungs: clear to auscultation bilaterally Chest wall: no tenderness Heart: regular rate and rhythm, S1, S2 normal, no murmur, click, rub or gallop Abdomen: painless purpura periumbilicus approx 6 x 4 cm Male genitalia: penis shows mild erythema along shaft Extremities: B/L patch of purpura with surrounding erythema in legs; Left triceps has painful purpura with surrounding erythema; B/L medial  thigh has small erythema approx 2-3 cm round Pulses: 2+ and symmetric Skin: see extremities above Lymph nodes: Cervical, supraclavicular, and  axillary nodes normal. Ears: B/L painless purpura around helix of ear  Lab results: Basic Metabolic Panel:  Basename 11/01/11 1256  NA 135  K 3.8  CL 101  CO2 24  GLUCOSE 103*  BUN 14  CREATININE 0.66  CALCIUM 8.7  MG --  PHOS --   Liver Function Tests: No results found for this basename: AST:2,ALT:2,ALKPHOS:2,BILITOT:2,PROT:2,ALBUMIN:2 in the last 72 hours No results found for this basename: LIPASE:2,AMYLASE:2 in the last 72 hours No results found for this basename: AMMONIA:2 in the last 72 hours CBC:  Basename 11/01/11 1256  WBC 5.4  NEUTROABS 1.7  HGB 13.5  HCT 39.7  MCV 76.9*  PLT 159   Cardiac Enzymes: No results found for this basename: CKTOTAL:3,CKMB:3,CKMBINDEX:3,TROPONINI:3 in the last 72 hours BNP: No results found for this basename: PROBNP:3 in the last 72 hours D-Dimer: No results found for this basename: DDIMER:2 in the last 72 hours CBG: No results found for this basename: GLUCAP:6 in the last 72 hours Hemoglobin A1C: No results found for this basename: HGBA1C in the last 72 hours Fasting Lipid Panel: No results found for this basename: CHOL,HDL,LDLCALC,TRIG,CHOLHDL,LDLDIRECT in the last 72 hours Thyroid Function Tests: No results found for this basename: TSH,T4TOTAL,FREET4,T3FREE,THYROIDAB in the last 72 hours Anemia Panel: No results found for this basename: VITAMINB12,FOLATE,FERRITIN,TIBC,IRON,RETICCTPCT in the last 72 hours Coagulation: No results found for this basename: LABPROT:2,INR:2 in the last 72 hours Urine Drug Screen: Drugs of Abuse     Component Value Date/Time   LABOPIA NONE DETECTED 11/01/2011 1356   COCAINSCRNUR POSITIVE* 11/01/2011 1356   LABBENZ NONE DETECTED 11/01/2011 1356   AMPHETMU NONE DETECTED 11/01/2011 1356   THCU POSITIVE* 11/01/2011 1356   LABBARB NONE DETECTED 11/01/2011 1356    Alcohol Level: No results found for this basename: ETH:2 in the last 72 hours Urinalysis:  Basename 11/01/11 1356  COLORURINE YELLOW    LABSPEC 1.025  PHURINE 6.0  GLUCOSEU NEGATIVE  HGBUR NEGATIVE  BILIRUBINUR NEGATIVE  KETONESUR NEGATIVE  PROTEINUR NEGATIVE  UROBILINOGEN 1.0  NITRITE NEGATIVE  LEUKOCYTESUR NEGATIVE    Imaging results:  No results found.   Assessment & Plan by Problem:  41 yo man with history of right 5th digit fracture leading to polypharmacy of antibiotics (cephalexin, bactrim ds, nystatin, augmentin) with continuing rashes throughout his body that are not improving. UDS also positive notably for cocaine.  Skin rash - Patient has had a history now of about 3 weeks of spreading rashes throughout his body. They begin with erythema then develop purpura in the center and then become painful. These rashes are associated around the use of certain antibiotics (cephalexin, bactrim, nystatin, augmentin) over the past several weeks with no resolve of his rashes. Differential includes levamisole vasculitis (levamisole is an anthelminthic commonly used as an adulterant as a cutting agent in cocaine--UDS positive; toxicities include agranulocytosis--neutropenia (32) this admission and WBC =3.9 on 10/13/11 with neutropenia (2) and vasculitis/necrosis) vs. erythema multiforme minor (common 2/2 sulfa or pcn)--self limiting and no treatment necessary vs. Stevens-Johnson Syndrome (less likely given that ulcers usually appear in the mucosal membranes--mouth/lips/anus/genitalia) - CMET (Levamisole decreases alk phos levels) - levamisole blood level - dermatology consult for possible skin biopsy  Asthma - Patient has a history of asthma. Currently stable and has not had any symptoms in many years. Used albuterol in the past.  DVT ppx - SCDs  This is  a Medical Student Note.  The care of the patient was discussed with Dr. Augustin Coupe and the assessment and plan was formulated with their assistance.  Please see their note for official documentation of the patient encounter.   SignedDwyane Dee 11/01/2011, 5:57 PM      Hospital Admission Note Date: 11/01/2011  Patient name: Bruce Vaughn Medical record number: [id number] Date of birth: [birth year] Age: 41 y.o. Gender: male PCP: DEFAULT,PROVIDER, MD, MD  Medical Service:          Internal Medicine Teaching Service    Attending physician:  Dr.  Terressa Koyanagi  1st Contact:  Dr. Augustin Coupe       Pager: [phone]  2nd Contact:  Dr. Guy Sandifer     Pager: [phone] After 5 pm or weekends: 1st Contact:      Pager: [phone] 2nd Contact:      Pager: [phone]  Chief Complaint:  Chief Complaint  Patient presents with  . Rash     History of Present Illness: Bruce Vaughn is a 41 y.o.male with past medical history significant for rash 6 years ago in response to a drug who presents with painful purpuric rash for 9 days.    History was gathered from the patient, his wife and the medical record as the patient and his wife were unsure of the dates and findings associated with his multiple visits.  On May 30 the patient was walking in his house and fractured his toe by hitting it on a table. He came to the ED where he had lidocaine injection in the toe was buddy taped. Approximately 3 days later on May 2, he again presented to the ED with a red, swollen fifth toe with a red streak to his ankle. The area is warm and tender and he was diagnosed with cellulitis. He was placed on a course of Keflex. On May 6 he again presented to the ED with a chief complaint of sores in his mouth. The ED note states the white plaques over his gums and he was diagnosed with thrush. They discontinued the Keflex as his foot swelling has gone down and he was started on nystatin for thrush. On the 10th the patient noticed a swollen area adjacent to the first MCP joint on his left hand. This also had purulent drainage with a red streak up his arm. Dr Fredna Dow was called for I&D. The patient received one dose of vancomycin in the ED, was taken to the OR and abscess was drained. Cultures  were positive for polymicrobial infection but no strep and no staph were identified. Patient was discharged on a course of Bactrim. He then came to the ED with complaints of a painful erythematous rash over multiple body sites. Marland Kitchen He was evaluated by Dr. Johnnye Sima in the ED who changed his antibiotics to Augmentin and gave him a course of prednisone. After the patient's symptoms did not improve he discontinued all medications 3 days later. He now presents for worsening rash. The rash encompasses his bilateral helices of his ears, the left posterior upper arm, the left flank, his left upper chest, his left lower abdomen, the shaft of his penis, and the bilateral medial ankles. The rash is painful with a sharp stinging character. Not itchy. Does not weep. He states it starts off as a ink area then becomes dark red before coming purple. He states the rash precedes pain. He also endorses pain on the bottom of his right foot that is  Constant and sharp, 7/10 currently, but peaked at 10/10.  Some radiation up the leg. Improved with pain medication in the ED.  He did not take anything at home. Pain is improved also with elevation. Pain is worse with dependency and pressure. No fevers, sweats, nausea, vomiting,  headaches, vision changes. Some chronic cough from smoking   Review of Systems: Bold Items are Positive Constitutional: Fever, chills, diaphoresis, appetite change and fatigue.  HEENT: Double vision, blurry vision, photophobia, eye pain, eye ulcer, redness, hearing loss, congestion, mouth sores, trouble swallowing, neck pain, neck stiffness and tinnitus.   Respiratory: SOB, DOE, cough, chest tightness,  and wheezing.   Cardiovascular: Chest pain, palpitations Gastrointestinal: Nausea, vomiting, abdominal pain, diarrhea, constipation, blood in stool and abdominal distention.  Genitourinary: Dysuria, urgency, frequency, hematuria, flank pain and difficulty urinating.  Musculoskeletal: Myalgias, back pain,,  arthralgias and gait problem.  Skin: Pallor, rash and wound.  Neurological: Dizziness,  numbness and headaches.  Hematological: Adenopathy.  Psychiatric/Behavioral:  confusion,   Past Medical History  Diagnosis Date  . Asthma     Past Surgical History  Procedure Date  . I&d extremity 10/13/2011    Procedure: IRRIGATION AND DEBRIDEMENT EXTREMITY;  Surgeon: Tami Ribas, MD;  Location: [facility] OR;  Service: Orthopedics;  Laterality: Left;    Meds: Medications Prior to Admission  Medication Sig Dispense Refill  . albuterol (PROVENTIL HFA;VENTOLIN HFA) 108 (90 BASE) MCG/ACT inhaler Inhale 2 puffs into the lungs every 6 (six) hours as needed. For wheezing        Allergies: Cephalosporins; Amoxicillin; Augmentin; Bactrim; Clindamycin/lincomycin; Penicillins; and Sulfa antibiotics  Family History  Problem Relation Age of Onset  . Diabetes Mother   . Diabetes Maternal Grandmother   . Allergies Daughter     mold allergy, treatment x 1 year    History   Social History  . Marital Status: Single    Spouse Name: N/A    Number of Children: 5  . Years of Education: 11th grade   Occupational History  . 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 Smoking status: Current Everyday Smoker -- 1.0 packs/day for 38 years    Types: Cigarettes  . Smokeless tobacco: Not on file  . Alcohol Use: 0.0 oz/week    .1 drink(s) per week  . Drug Use: 1 per week    Special: Marijuana  . Sexually Active: Not on file   Other Topics Concern  . Not on file   Social History Narrative   Has  5 kids. Cleans pools. Daughter has chronic urticaria (?allergic to mold).     Physical Exam: Blood pressure 118/60, pulse 82, temperature 98.9 F (37.2 C), temperature source Oral, resp. rate 19, height 6' (1.829 m), weight 215 lb 12.8 oz (97.886 kg), SpO2 99.00%. Gen: Well-developed, well-nourished male  in no acute distress; alert, appropriate and  cooperative throughout examination. Head: Normocephalic, atraumatic. Eyes: PERRL, EOMI, No signs of anemia or jaundince.no mucocutaneous lesions observed  Nose: Mucous membranes moist, not inflammed, nonerythematous. No mucocutaneous lesions observed  Throat: Oropharynx nonerythematous, no exudate appreciated. No mucocutaneous lesions observed  Neck: Supple with no deformities, masses, or tenderness noted.  No carotid Bruits, no JVD. Lungs: Normal respiratory effort. Clear to auscultation BL, without crackles or wheezes. Heart: RRR. S1 and S2 normal without  murmur, gallop,or rubs. Abdomen: BS normoactive. Soft, nondistended, non-tender. No masses or organomegaly. Extremities: No pretibial edema.  Neurologic: A&O X3, CN II - XII are grossly intact. Motor strength is 5/5 in the all 4 extremities, Sensations intact to light touch. No focal neurologic deficit Skin: Nonblanching, Stellate purpura superimposed on a background of blanchable erythema, lesions are present on the bilateral medial ankles left lower abdomen lateral to the umbilicus, left upper chest and left posterior upper arm. The left posterior upper arm does have a single small 8 mm bulla. There is mild erythema to the left side of the penile shaft. Psych: mood and affect are normal.    Lab results: Basic Metabolic Panel:  Basename 11/01/11 1256  NA 135  K 3.8  CL 101  CO2 24  GLUCOSE 103*  BUN 14  CREATININE 0.66  CALCIUM 8.7  MG --  PHOS --   CBC:  Basename 11/01/11 1256  WBC 5.4  NEUTROABS 1.7  HGB 13.5  HCT 39.7  MCV 76.9*  PLT 159   Coagulation:  Basename 11/01/11 1843  LABPROT 13.8  INR 1.04   Urine Drug Screen: Drugs of Abuse     Component Value Date/Time   LABOPIA NONE DETECTED 11/01/2011 1356   COCAINSCRNUR POSITIVE* 11/01/2011 1356   LABBENZ NONE DETECTED 11/01/2011 1356   AMPHETMU NONE DETECTED 11/01/2011 1356   THCU POSITIVE* 11/01/2011 1356   LABBARB NONE DETECTED 11/01/2011 1356     Urinalysis:  Results for JAMARIE, MUSSA (MRN [id number]) as of 11/01/2011 21:09  Ref. Range 11/01/2011 13:56  Color, Urine Latest Range: YELLOW  YELLOW  APPearance Latest Range: CLEAR  CLEAR  Specific Gravity, Urine Latest Range: 1.005-1.030  1.025  pH Latest Range: 5.0-8.0  6.0  Glucose, UA Latest Range: NEGATIVE mg/dL NEGATIVE  Bilirubin Urine Latest Range: NEGATIVE  NEGATIVE  Ketones, ur Latest Range: NEGATIVE mg/dL NEGATIVE  Protein Latest Range: NEGATIVE mg/dL NEGATIVE  Urobilinogen, UA Latest Range: 0.0-1.0 mg/dL 1.0  Nitrite Latest Range: NEGATIVE  NEGATIVE  Leukocytes, UA Latest Range: NEGATIVE  NEGATIVE  Hgb urine dipstick Latest Range: NEGATIVE  NEGATIVE    Misc. Labs: Rheumatoid factor negative  Assessment & Plan by Problem: Principal Problem:  *Purpura - most likely diagnosis is vasculopathy/vasculitis versus erythema multiforme.  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 Ninetta Lights. Patients with vasculopathy from cocaine are nearly all positive for c-ANCA. He also has numerous drug exposures which could have led him to have a leukocytoclastic vasculitis in response. Lastly he could have erythema multiforme secondary to either drug exposure or  herpes infection, as he does have a history of herpes esophagitis. Finally infectious etiology such as Neisseria meningitidis or vibrio Dermatology has been consulted and will perform a skin biopsy tonight.  -- Admit to inpatient with respiratory isolation on the recommendation of Dr. Ninetta Lights -- Follow HIV, blood cultures, and hepatitis panel -- Follow ANCA panel -- The levamisole urine test sent --Will start aztreonam or imipenem if he decompensates overnight Appreciate Dr. Ninetta Lights bringing [place] in on this case.  Active Problems:  Asthma - stable and well-controlled. Will order albuterol inhaler in case the patient  requires.  DVT prophylaxis-SCDs  Disposition and overall medical Management - Pending resolution of his purpuric rash. Overall, this patient desperately needs a PCP  Signed: Jailah Willis, Declan 11/01/2011, 8:42 PM

## 2011-11-01 NOTE — ED Notes (Signed)
Pt from home.  States that he has had black, red and purple colored welps and pain all over his body x 5 weeks.  Has been seen multiple times in the ED for eval, referral to infectious disease has been made but patient has not yet seen them.  Pt allergic to all medications that he has been put on for possible treatment.  Pt also reports extreme (R) foot pain, no welp or rash noted on foot.

## 2011-11-01 NOTE — Consult Note (Addendum)
Date of Admission:  11/01/2011  Date of Consult:  11/01/2011  Reason for Consult:Rash Referring Physician: Alto Denver  Impression/Recommendation Purpuric Rash Would- check BCx Check HIV, Hepatitis C, Hepatitis B Urine Drug screen Check ANA, ANCA, RF,  Derm eval , skin Bx Could consider Aztreonam or imipenem and cubicin if he appears unstable (drop in BP, fever) Strongly consider admission As we don't know etiology of rash, would put in isolation  Comment- of the infectious etiologies that would look like this- vibrio would match his history. It would seem less likely given his treatment with anbx treatmtent. Neisseria meningitides could give a rash like this but i would expect him to be extremely toxic if he were to have had this infection for this duration.  Endocarditis could give emboli to skin but these are atypical for this. I do not hear a murmur (although exam is not very sensitive). A vasculitis (drug induced) could certainly cause this. Hopefully a skin bx can help [place] with this dx.    Bruce Vaughn is an 41 y.o. male.  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 Over the next 4 hours he developed erythema up his arm and he was seen in ED. He underwent I & D and was sent home with septra. His Cx grew multiple organisms, no staph or strep. After the firs day of being on anbx he developed a sore mouth, and then developed erythematous lesions on his arms, trunk. He was seen in ED 5-20.Marland Kitchen Of note, he had a lesion on his L flank that was erythematous with a hemorrhagic center. He was started on steroids, augmentin, states that he took this for 3 days and then stopped as his lesions were worsening. He was unable to f/u with MD as he has no PCP.  He has developed more erythematous lesions with hemorrhagic centers, on his abd, legs, ears.   He states that his L thumb has actually improved during this period. He has improved range of  motion. Has had no heat, no wound drainage, no pain, no proximal erythema.  ROS-No SOB, dysphagia, no headaches, no change in vision, normal BM, normal urination, no oral ulcers. Denies sick contacts (wife recently had URI)  Past Medical History  Diagnosis Date  . Asthma     Past Surgical History  Procedure Date  . I&d extremity 10/13/2011    Procedure: IRRIGATION AND DEBRIDEMENT EXTREMITY;  Surgeon: Tami Ribas, MD;  Location: [facility] OR;  Service: Orthopedics;  Laterality: Left;  ergies:   Allergies  Allergen Reactions  . Cephalosporins Other (See Comments)    Bumps  . Amoxicillin Itching and Rash  . Augmentin (Amoxicillin-Pot Clavulanate) Itching and Rash    Red spots  . Bactrim (Sulfamethoxazole-Tmp Ds) Swelling and Rash    Numbness and tingling in tounge  . Clindamycin/Lincomycin Itching and Rash  . Penicillins Rash  . Prednisone Itching and Rash    Red spots  . Sulfa Antibiotics Itching and Rash    Medications: Prior to Admission:  (Not in a hospital admission) none  Social History:  reports that he has been smoking.  He does not have any smokeless tobacco history on file. He reports that he drinks alcohol. He reports that he does not use illicit drugs.  History reviewed. No pertinent family history.    Blood pressure 117/74, pulse 82, temperature 98.3 F (36.8 C), temperature source Oral, resp. rate 20,  SpO2 97.00%. General appearance: alert, cooperative and mild distress Eyes: negative findings: conjunctivae and sclerae normal and pupils equal, round, reactive to light and accomodation Ears: there are scabbed lesions on his pinnae Throat: lips, mucosa, and tongue normal; teeth and gums normal Neck: no adenopathy Lungs: clear to auscultation bilaterally Heart: regular rate and rhythm Abdomen: normal findings: bowel sounds normal and soft, non-tender Extremities: R foot is erythematous, mild. hyperesthetic. there is a erythematous lesion with a hemmorhagic center  on his ankle. Skin: multiple discreet hemmorhagic centered, erythematous lesions.  Lymph nodes: Cervical, supraclavicular, and axillary nodes normal.   Results for orders placed during the hospital encounter of 11/01/11 (from the past 48 hour(s))  CBC     Status: Abnormal   Collection Time   11/01/11 12:56 PM      Component Value Range Comment   WBC 5.4  4.0 - 10.5 (K/uL)    RBC 5.16  4.22 - 5.81 (MIL/uL)    Hemoglobin 13.5  13.0 - 17.0 (g/dL)    HCT 13.0  86.5 - 78.4 (%)    MCV 76.9 (*) 78.0 - 100.0 (fL)    MCH 26.2  26.0 - 34.0 (pg)    MCHC 34.0  30.0 - 36.0 (g/dL)    RDW 69.6  29.5 - 28.4 (%)    Platelets 159  150 - 400 (K/uL)   DIFFERENTIAL     Status: Abnormal   Collection Time   11/01/11 12:56 PM      Component Value Range Comment   Neutrophils Relative 32 (*) 43 - 77 (%)    Neutro Abs 1.7  1.7 - 7.7 (K/uL)    Lymphocytes Relative 51 (*) 12 - 46 (%)    Lymphs Abs 2.7  0.7 - 4.0 (K/uL)    Monocytes Relative 16 (*) 3 - 12 (%)    Monocytes Absolute 0.8  0.1 - 1.0 (K/uL)    Eosinophils Relative 1  0 - 5 (%)    Eosinophils Absolute 0.1  0.0 - 0.7 (K/uL)    Basophils Relative 0  0 - 1 (%)    Basophils Absolute 0.0  0.0 - 0.1 (K/uL)   BASIC METABOLIC PANEL     Status: Abnormal   Collection Time   11/01/11 12:56 PM      Component Value Range Comment   Sodium 135  135 - 145 (mEq/L)    Potassium 3.8  3.5 - 5.1 (mEq/L)    Chloride 101  96 - 112 (mEq/L)    CO2 24  19 - 32 (mEq/L)    Glucose, Bld 103 (*) 70 - 99 (mg/dL)    BUN 14  6 - 23 (mg/dL)    Creatinine, Ser 1.32  0.50 - 1.35 (mg/dL)    Calcium 8.7  8.4 - 10.5 (mg/dL)    GFR calc non Af Amer >90  >90 (mL/min)    GFR calc Af Amer >90  >90 (mL/min)       Component Value Date/Time   SDES ABSCESS HAND LEFT 10/13/2011 1933   SDES ABSCESS HAND LEFT 10/13/2011 1933   SPECREQUEST PATIENT ON FOLLOWING VANCOMYCIN,KEFLEX 10/13/2011 1933   SPECREQUEST PATIENT ON FOLLOWING VANCOMYCIN,KEFLEX 10/13/2011 1933   CULT  Value: MULTIPLE  ORGANISMS PRESENT, NONE PREDOMINANT Note: NO STAPHYLOCOCCUS AUREUS ISOLATED NO GROUP A STREP (S.PYOGENES) ISOLATED 10/13/2011 1933   CULT NO ANAEROBES ISOLATED 10/13/2011 1933   REPTSTATUS 10/17/2011 FINAL 10/13/2011 1933   REPTSTATUS 10/18/2011 FINAL 10/13/2011 1933   No results found.  Thank you so  much for this interesting consult,   [name] [phone] 11/01/2011, 2:03 PM     LOS: 0 days

## 2011-11-01 NOTE — ED Notes (Signed)
Pt placed on monitor prior to 2mg  dilaudid IV being given.

## 2011-11-01 NOTE — ED Notes (Signed)
Bumps on arms abd and legs . Has been having these for a while  And has come here for them  Nothing helps

## 2011-11-01 NOTE — ED Provider Notes (Signed)
History     CSN: [id number]  Arrival date & time 11/01/11  3329   First MD Initiated Contact with Patient 11/01/11 [phone]      Chief Complaint  Patient presents with  . Rash    (Consider location/radiation/quality/duration/timing/severity/associated sxs/prior treatment) HPI  Patient presetns to the ED with complaints of rash all over body. approx 3 weeks ago patient had IandD procedure done by Dr. Merlyn Lot to the left thumb. He was started on Bactrim and the next day developed rash to his left tricep and over his left flank. Patient was seen in ED 9 days ago for this. Iand D was called and Doctor Ninetta Lights came to evaluate patient.  He was given IV Vanc in ED and discharged with PO vanc and steroids. The patient states he took them for a few days and then discontinued because the rash was worsening and not getting better with the medication. The patients rash since 9 days ago has continued to progressively get worse. HE is now having severe pain and the rash is on his bilateral legs, abdomen, chest, bilateral arms and ears. It currently is not in his mouth but he admits to having a lesion there this past week that is now gone. He has not been febrile, he has not had nausea, vomiting or diarrhea.   Past Medical History  Diagnosis Date  . Asthma     Past Surgical History  Procedure Date  . I&d extremity 10/13/2011    Procedure: IRRIGATION AND DEBRIDEMENT EXTREMITY;  Surgeon: Tami Ribas, MD;  Location: [facility] OR;  Service: Orthopedics;  Laterality: Left;    History reviewed. No pertinent family history.  History  Substance Use Topics  . Smoking status: Current Everyday Smoker  . Smokeless tobacco: Not on file  . Alcohol Use: Yes      Review of Systems   HEENT: denies blurry vision or change in hearing PULMONARY: Denies difficulty breathing and SOB CARDIAC: denies chest pain or heart palpitations MUSCULOSKELETAL:  denies being unable to ambulate ABDOMEN AL: denies abdominal  pain GU: denies loss of bowel or urinary control NEURO: denies numbness and tingling in extremities   Allergies  Cephalosporins; Amoxicillin; Augmentin; Bactrim; Clindamycin/lincomycin; Penicillins; and Sulfa antibiotics  Home Medications   Current Outpatient Rx  Name Route Sig Dispense Refill  . ALBUTEROL SULFATE HFA 108 (90 BASE) MCG/ACT IN AERS Inhalation Inhale 2 puffs into the lungs every 6 (six) hours as needed. For wheezing      BP 117/74  Pulse 82  Temp(Src) 98.3 F (36.8 C) (Oral)  Resp 20  SpO2 97%  Physical Exam  Nursing note and vitals reviewed. Constitutional: He appears well-developed and well-nourished. No distress.  HENT:  Head: Normocephalic and atraumatic.    Eyes: Pupils are equal, round, and reactive to light.  Neck: Normal range of motion. Neck supple.  Cardiovascular: Normal rate and regular rhythm.   Pulmonary/Chest: Effort normal.  Abdominal: Soft.  Neurological: He is alert.  Skin: Skin is warm and dry.       ED Course  Procedures (including critical care time)  Labs Reviewed  CBC - Abnormal; Notable for the following:    MCV 76.9 (*)    All other components within normal limits  DIFFERENTIAL - Abnormal; Notable for the following:    Neutrophils Relative 32 (*)    Lymphocytes Relative 51 (*)    Monocytes Relative 16 (*)    All other components within normal limits  BASIC METABOLIC PANEL - Abnormal; Notable  for the following:    Glucose, Bld 103 (*)    All other components within normal limits  URINE RAPID DRUG SCREEN (HOSP PERFORMED) - Abnormal; Notable for the following:    Cocaine POSITIVE (*)    Tetrahydrocannabinol POSITIVE (*)    All other components within normal limits  URINALYSIS, ROUTINE W REFLEX MICROSCOPIC  HIV 1 RNA QUANT-NO REFLEX-BLD  HIV ANTIBODY (ROUTINE TESTING)  URINE RAPID DRUG SCREEN (HOSP PERFORMED)  ANCA SCREEN W REFLEX TITER  ANA  HEPATITIS PANEL, ACUTE  RHEUMATOID FACTOR  CULTURE, BLOOD (ROUTINE X 2)   CULTURE, BLOOD (ROUTINE X 2)   No results found.   1. Rash, vesicular       MDM   Dr. Alto Denver has evaluated patient has well. Dr. Ninetta Lights has come to the ED to evaluate patient again and felt it was best to admit patient to [facility] medicine for further work-up, treatment and management.   The patients pain was difficult to control in the ED, 2mg  Iv Dilaudid did give the patient some relief.   Vital signs remained stable while in the ED.  The rash does not have oral lesions as with consistent with SJS, however, this differential has been considered.       Dorthula Matas, PA 11/01/11 1525

## 2011-11-01 NOTE — ED Notes (Signed)
Infectious MD at bedside.

## 2011-11-02 LAB — BASIC METABOLIC PANEL
BUN: 10 mg/dL (ref 6–23)
CO2: 26 mEq/L (ref 19–32)
GFR calc non Af Amer: >90 mL/min (ref >90)
Glucose, Bld: 103 mg/dL — ABNORMAL HIGH (ref 70–99)
Potassium: 4.1 mEq/L (ref 3.5–5.1)
Sodium: 136 mEq/L (ref 135–145)

## 2011-11-02 LAB — CBC
HCT: 40 % (ref 39.0–52.0)
Hemoglobin: 13.4 g/dL (ref 13.0–17.0)
MCH: 25.9 pg — ABNORMAL LOW (ref 26.0–34.0)
MCHC: 33.5 g/dL (ref 30.0–36.0)
MCV: 77.4 fL — ABNORMAL LOW (ref 78.0–100.0)
RBC: 5.17 MIL/uL (ref 4.22–5.81)

## 2011-11-02 LAB — ANA: Anti Nuclear Antibody(ANA): POSITIVE — AB

## 2011-11-02 LAB — ANTI-NUCLEAR AB-TITER (ANA TITER): ANA Titer 1: 1:40 {titer} — ABNORMAL HIGH

## 2011-11-02 LAB — HEPATITIS PANEL, ACUTE
Hep A IgM: NEGATIVE
Hep B C IgM: NEGATIVE
Hepatitis B Surface Ag: NEGATIVE

## 2011-11-02 MED ORDER — DIPHENHYDRAMINE HCL 25 MG PO CAPS
25.0000 mg | ORAL_CAPSULE | Freq: Once | ORAL | Status: AC
  Administered 2011-11-02: 25 mg via ORAL
  Filled 2011-11-02: qty 1

## 2011-11-02 MED ORDER — OXYCODONE HCL 5 MG PO TABS
7.5000 mg | ORAL_TABLET | ORAL | Status: DC | PRN
  Administered 2011-11-02 – 2011-11-03 (×4): 7.5 mg via ORAL
  Filled 2011-11-02 (×4): qty 2

## 2011-11-02 NOTE — Progress Notes (Signed)
Medical Student Daily Progress Note  Subjective: Patient is doing well this morning. He slept well overnight and had no new complaints. His right big toe is still tender and his rashes have not worsened or progressed. He denies any fevers, chills, night sweats, or N/V/D, constipation. Objective: Vital signs in last 24 hours: Filed Vitals:   11/01/11 1739 11/01/11 1800 11/01/11 2125 11/02/11 0637  BP: 110/66 118/60 128/68 110/73  Pulse: 88 82 84 84  Temp: 99.1 F (37.3 C) 98.9 F (37.2 C) 98.6 F (37 C) 98.1 F (36.7 C)  TempSrc: Oral Oral Oral Oral  Resp: 20 19 20 20   Height:  6' (1.829 m)    Weight:  97.886 kg (215 lb 12.8 oz)    SpO2: 99%  98% 97%   Weight change:  No intake or output data in the 24 hours ending 11/02/11 1041 Physical Exam: BP 110/73  Pulse 84  Temp(Src) 98.1 F (36.7 C) (Oral)  Resp 20  Ht 6' (1.829 m)  Wt 97.886 kg (215 lb 12.8 oz)  BMI 29.27 kg/m2  SpO2 97% General appearance: alert, cooperative and no distress Head: Normocephalic, without obvious abnormality, atraumatic Ears: B/L purpuric lesions on helices Back: symmetric, no curvature. ROM normal. No CVA tenderness. Lungs: clear to auscultation bilaterally Chest wall: purpura with surrounding erythema on left flank, tender to touch Heart: regular rate and rhythm, S1, S2 normal, no murmur, click, rub or gallop Abdomen: periumbilical purpuric rash with surrounding erythema (approx. 6 x 5 cm) Male genitalia: minor erythema along length of penis Extremities: purpura with surrounding erythema mildly indurated and tender: B/L lateral legs, B/L medial thighs, left triceps Pulses: 2+ and symmetric Skin: see extremities Lab Results: Basic Metabolic Panel:  Lab 77/41/28 0545 11/01/11 1256  NA 136 135  K 4.1 3.8  CL 100 101  CO2 26 24  GLUCOSE 103* 103*  BUN 10 14  CREATININE 0.76 0.66  CALCIUM 8.8 8.7  MG -- --  PHOS -- --   Liver Function Tests: No results found for this basename:  AST:2,ALT:2,ALKPHOS:2,BILITOT:2,PROT:2,ALBUMIN:2 in the last 168 hours No results found for this basename: LIPASE:2,AMYLASE:2 in the last 168 hours No results found for this basename: AMMONIA:2 in the last 168 hours CBC:  Lab 11/02/11 0545 11/01/11 1256  WBC 5.9 5.4  NEUTROABS -- 1.7  HGB 13.4 13.5  HCT 40.0 39.7  MCV 77.4* 76.9*  PLT 155 159   Cardiac Enzymes: No results found for this basename: CKTOTAL:3,CKMB:3,CKMBINDEX:3,TROPONINI:3 in the last 168 hours BNP: No results found for this basename: PROBNP:3 in the last 168 hours D-Dimer: No results found for this basename: DDIMER:2 in the last 168 hours CBG: No results found for this basename: GLUCAP:6 in the last 168 hours Hemoglobin A1C: No results found for this basename: HGBA1C in the last 168 hours Fasting Lipid Panel: No results found for this basename: CHOL,HDL,LDLCALC,TRIG,CHOLHDL,LDLDIRECT in the last 168 hours Thyroid Function Tests: No results found for this basename: TSH,T4TOTAL,FREET4,T3FREE,THYROIDAB in the last 168 hours Coagulation:  Lab 11/01/11 1843  LABPROT 13.8  INR 1.04   Anemia Panel: No results found for this basename: VITAMINB12,FOLATE,FERRITIN,TIBC,IRON,RETICCTPCT in the last 168 hours Urine Drug Screen: Drugs of Abuse     Component Value Date/Time   LABOPIA NONE DETECTED 11/01/2011 1356   COCAINSCRNUR POSITIVE* 11/01/2011 1356   LABBENZ NONE DETECTED 11/01/2011 1356   AMPHETMU NONE DETECTED 11/01/2011 1356   THCU POSITIVE* 11/01/2011 1356   LABBARB NONE DETECTED 11/01/2011 1356    Alcohol Level: No results  found for this basename: ETH:2 in the last 168 hours Urinalysis:  Lab 11/01/11 [place] 1.025  PHURINE 6.0  Paul NEGATIVE  UROBILINOGEN 1.0  NITRITE NEGATIVE  LEUKOCYTESUR NEGATIVE    Micro Results: Recent Results (from the past 240 hour(s))  CULTURE, BLOOD (ROUTINE X 2)      Status: Normal (Preliminary result)   Collection Time   11/01/11  2:55 PM      Component Value Range Status Comment   Specimen Description BLOOD ARM LEFT   Final    Special Requests BOTTLES DRAWN AEROBIC AND ANAEROBIC 10CC   Final    Culture  Setup Time [id number]   Final    Culture     Final    Value:        BLOOD CULTURE RECEIVED NO GROWTH TO DATE CULTURE WILL BE HELD FOR 5 DAYS BEFORE ISSUING A FINAL NEGATIVE REPORT   Report Status PENDING   Incomplete   CULTURE, BLOOD (ROUTINE X 2)     Status: Normal (Preliminary result)   Collection Time   11/01/11  3:02 PM      Component Value Range Status Comment   Specimen Description BLOOD HAND LEFT   Final    Special Requests BOTTLES DRAWN AEROBIC ONLY [facility]   Final    Culture  Setup Time [id number]   Final    Culture     Final    Value:        BLOOD CULTURE RECEIVED NO GROWTH TO DATE CULTURE WILL BE HELD FOR 5 DAYS BEFORE ISSUING A FINAL NEGATIVE REPORT   Report Status PENDING   Incomplete    Studies/Results: No results found. Medications: I have reviewed the patient's current medications. Scheduled Meds:   . docusate sodium  100 mg Oral BID  .  HYDROmorphone (DILAUDID) injection  2 mg Intravenous Once  . ondansetron  4 mg Intravenous Once   Continuous Infusions:  PRN Meds:.acetaminophen, acetaminophen, albuterol, oxyCODONE, polyethylene glycol, promethazine, traZODone Assessment/Plan:  Rash - Patient has no new rashes that have developed overnight. Differential still includes a vasculitis (levamisole toxicity vs. leukocytoclastic), vs. erythema multiforme minor vs. Environmental exposure 2/2 pool cleaning (toxins, chemicals, organisms) - Appreciate ID and dermatology consults in the management of this patient - levamisole urine pending - consult to dermatology for skin biopsy pending - c-ANCA pending  Right foot pain - 1st metatarsal on his right foot is warm and very tender to touch. He has no history of gout however he cannot  move the toe at all. He fractured the 5th digit on the same foot about 1 month ago. Pain could be 2/2 the pain he describes before new rashes appear. Will hold off on tapping the joint for now.  Asthma - Stable. No flares in many years. - Albuterol PRN   Disposition - Patient will receive a skin punch biopsy today.    LOS: 1 day   This is a Careers information officer Note.  The care of the patient was discussed with Dr. Augustin Coupe and the assessment and plan formulated with their assistance.  Please see their attached note for official documentation of the daily encounter.  Dwyane Dee 11/02/2011, 10:41 AM

## 2011-11-02 NOTE — H&P (Signed)
Internal Medicine Attending Admission Note Date: 11/02/2011  Patient name: MAZIAH KEELING Medical record number: [id number] Date of birth: [birth year] Age: 41 y.o. Gender: male  I saw and evaluated the patient. I reviewed the resident's note and I agree with the resident's findings and plan as documented in the resident's note.  Chief Complaint(s): Painful rash.  History - key components related to admission:  Mr. Kronberg is a 41 year old man with a history of cocaine abuse who presents with a painful rash that has come and gone over the past one month. He notes that the rash had actually begun approximately 3-4 years ago when he developed oral lesions associated with necrosis. Over the past month he can tell he is about to obtain a painful rash. He has some discomfort which progresses to redness and then frank necrosis. These lesions have occurred on his feet, legs, abdomen, arms, and bilateral helices of the ears. He presents for evaluation.  Physical Exam - key components related to admission:  Filed Vitals:   11/01/11 1800 11/01/11 2125 11/02/11 0637 11/02/11 1416  BP: 118/60 128/68 110/73 131/78  Pulse: 82 84 84 86  Temp: 98.9 F (37.2 C) 98.6 F (37 C) 98.1 F (36.7 C) 98.1 F (36.7 C)  TempSrc: Oral Oral Oral Oral  Resp: 19 20 20 18   Height: 6' (1.829 m)     Weight: 215 lb 12.8 oz (97.886 kg)     SpO2:  98% 97% 98%   General: Well-developed, well-nourished, man lying comfortably in bed in no acute distress. Lungs: Clear to auscultation bilaterally. Heart: Regular rate and rhythm without murmurs, rubs, or gallops. Abdomen: Soft, nontender, active bowel sounds. Extremities: Without edema. Skin: Stellate purpuric lesions with necrosis on the bilateral medial ankles, left lower abdomen lateral to the umbilicus, left upper chest and left posterior upper arm. There is mild erythema of the first toe on the right with hyperesthesia to light touch.  Lab results:  Basic Metabolic  Panel:  Basename 11/02/11 0545 11/01/11 1256  NA 136 135  K 4.1 3.8  CL 100 101  CO2 26 24  GLUCOSE 103* 103*  BUN 10 14  CREATININE 0.76 0.66  CALCIUM 8.8 8.7  MG -- --  PHOS -- --   CBC:  Basename 11/02/11 0545 11/01/11 1256  WBC 5.9 5.4  NEUTROABS -- 1.7  HGB 13.4 13.5  HCT 40.0 39.7  MCV 77.4* 76.9*  PLT 155 159   Coagulation:  Basename 11/01/11 1843  INR 1.04   Urine Drug Screen:  Positive for cocaine and marijuana.  Urinalysis:  Unremarkable  Misc. Labs:  Rheumatoid factor negative HIV negative ANC 78 on May 9.  Imaging results:  No results found.  Assessment & Plan by Problem:  Mr. Grenz is a 41 year old man with a history of cocaine abuse who presents with a waxing and waning necrotic rash over the past month and extending actually over the past few years associated with cocaine abuse. Given the necrosis and the predilection for the helices of the ear our main concern is for levamisole toxicity. The differential includes other rheumatologic processes although these are felt to be less likely in the setting of cocaine abuse.  1) Presumed levamisole toxicity: Multiple providers explained to Mr. Hicklin the risks of cocaine abuse especially if laced with levamisole with regards to his skin. Significant necrosis and infection could set in if he were to continue to abuse cocaine. He felt strongly that his cocaine use had nothing to do with  this rash. We will continue to try to educate him while an inpatient. We will also obtain rheumatologic studies such as a C-ANCA and an ANA as well as a levamisole urine level to try to pin down a diagnosis. Mr. Sallade is ameniable to a punch biopsy which we will perform tomorrow morning.  2) Disposition: Once cleared from isolation for possible meningococcus Mr. Wyss should be safe to return home with followup in the Internal Medicine Center to review any pending labs and biopsy results.

## 2011-11-02 NOTE — Progress Notes (Signed)
Resident Co-sign Daily Note: I have seen the patient and reviewed the daily progress note by Lewie Chamber and discussed the care of the patient with them.  See below for documentation of my findings, assessment, and plans.  Subjective: Felling improved today. Thinks rash is getting better. Pain control still sub-optimal  Objective: Vital signs in last 24 hours: Filed Vitals:   11/01/11 1800 11/01/11 2125 11/02/11 0637 11/02/11 1416  BP: 118/60 128/68 110/73 131/78  Pulse: 82 84 84 86  Temp: 98.9 F (37.2 C) 98.6 F (37 C) 98.1 F (36.7 C) 98.1 F (36.7 C)  TempSrc: Oral Oral Oral Oral  Resp: 19 20 20 18   Height: 6' (1.829 m)     Weight: 215 lb 12.8 oz (97.886 kg)     SpO2:  98% 97% 98%   Physical Exam: General: resting in chair comfortably Skin: Nonblanching, Stellate purpura superimposed on a background of blanchable erythema, lesions are present on the bilateral medial ankles left lower abdomen lateral to the umbilicus, left upper chest and left posterior upper arm. The left posterior upper arm does have a single small 8 mm bulla. OVerall improved today  Lab Results: Reviewed and documented in Electronic Record Micro Results: Reviewed and documented in Electronic Record Studies/Results: Reviewed and documented in Electronic Record Medications: I have reviewed the patient's current medications. Scheduled Meds:   . 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 Labs so far are consistent, with + ANA.  Will await c-ANCA and send out of Levamisole level.  I spoke with the Derm consult, who stated that the ED only had sent her pictures and she was not intending to come in. She also suspects it is levamisole, esp with UDS pos for cocaine. RF, HIV and hepatitis panel  negative. IMTS will obtain skin biopsy tomorrow. DDx at this point is levamisole (will show coagulation/necrosis on bx). Vasculitis (will show fibrinoid necrosis), EM much less likely.    -- await results of c-ANCA -- await levamisole level -- oxy IR 7.5 Q4H   Dispo: tomorrow once resp isolation is cleared per Dr. Ninetta Lights. Will plan on follow-up in our clinic    LOS: 1 day   [facility], Rodrigo 11/02/2011, 5:19 PM

## 2011-11-02 NOTE — Progress Notes (Signed)
INFECTIOUS DISEASE PROGRESS NOTE  ID: Bruce Vaughn is a 41 y.o. male with  Principal Problem:  *Purpura Active Problems:  Asthma  Subjective: Feels better, now with pain and redness in his R foot, 1st mtp   Abtx:  Anti-infectives    None      Medications:  Scheduled:   . docusate sodium  100 mg Oral BID    Objective: Vital signs in last 24 hours: Temp:  [97.8 F (36.6 C)-99.1 F (37.3 C)] 98.1 F (36.7 C) (05/30 1416) Pulse Rate:  [82-88] 86  (05/30 1416) Resp:  [18-20] 18  (05/30 1416) BP: (101-131)/(60-78) 131/78 mmHg (05/30 1416) SpO2:  [97 %-100 %] 98 % (05/30 1416) Weight:  [97.886 kg (215 lb 12.8 oz)] 97.886 kg (215 lb 12.8 oz) (05/29 1800)   General appearance: alert, cooperative and no distress Resp: clear to auscultation bilaterally Cardio: regular rate and rhythm GI: normal findings: bowel sounds normal and soft, non-tender Skin: erythema on foot over 1st mtp and proximally. hyperesthetic.   Lab Results  Basename 11/02/11 0545 11/01/11 1256  WBC 5.9 5.4  HGB 13.4 13.5  HCT 40.0 39.7  NA 136 135  K 4.1 3.8  CL 100 101  CO2 26 24  BUN 10 14  CREATININE 0.76 0.66  GLU -- --   Liver Panel No results found for this basename: PROT:2,ALBUMIN:2,AST:2,ALT:2,ALKPHOS:2,BILITOT:2,BILIDIR:2,IBILI:2 in the last 72 hours Sedimentation Rate No results found for this basename: ESRSEDRATE in the last 72 hours C-Reactive Protein No results found for this basename: CRP:2 in the last 72 hours  Microbiology: Recent Results (from the past 240 hour(s))  CULTURE, BLOOD (ROUTINE X 2)     Status: Normal (Preliminary result)   Collection Time   11/01/11  2:55 PM      Component Value Range Status Comment   Specimen Description BLOOD ARM LEFT   Final    Special Requests BOTTLES DRAWN AEROBIC AND ANAEROBIC 10CC   Final    Culture  Setup Time [id number]   Final    Culture     Final    Value:        BLOOD CULTURE RECEIVED NO GROWTH TO DATE CULTURE WILL BE HELD  FOR 5 DAYS BEFORE ISSUING A FINAL NEGATIVE REPORT   Report Status PENDING   Incomplete   CULTURE, BLOOD (ROUTINE X 2)     Status: Normal (Preliminary result)   Collection Time   11/01/11  3:02 PM      Component Value Range Status Comment   Specimen Description BLOOD HAND LEFT   Final    Special Requests BOTTLES DRAWN AEROBIC ONLY [facility]   Final    Culture  Setup Time [id number]   Final    Culture     Final    Value:        BLOOD CULTURE RECEIVED NO GROWTH TO DATE CULTURE WILL BE HELD FOR 5 DAYS BEFORE ISSUING A FINAL NEGATIVE REPORT   Report Status PENDING   Incomplete     Studies/Results: No results found.   Assessment/Plan: Rash, ? Vasculitis, lavimisole? HIV, Hep B/C (-) so far.   Derm (Dr [place] to eval) Possible bx?  Await BCx.  Equivocal about continuing isolation, does not seem to infectious in nature   My appreciation to [facility]   Bruce Vaughn Infectious Diseases [phone] 11/02/2011, 3:26 PM   LOS: 1 day

## 2011-11-03 DIAGNOSIS — D692 Other nonthrombocytopenic purpura: Principal | ICD-10-CM

## 2011-11-03 DIAGNOSIS — K219 Gastro-esophageal reflux disease without esophagitis: Secondary | ICD-10-CM

## 2011-11-03 MED ORDER — KETOROLAC TROMETHAMINE 30 MG/ML IJ SOLN
30.0000 mg | Freq: Once | INTRAMUSCULAR | Status: AC
  Administered 2011-11-03: 30 mg via INTRAVENOUS
  Filled 2011-11-03: qty 1

## 2011-11-03 MED ORDER — OMEPRAZOLE 40 MG PO CPDR: 40.0000 mg | DELAYED_RELEASE_CAPSULE | Freq: Every day | ORAL | Status: DC

## 2011-11-03 MED ORDER — NAPROXEN 500 MG PO TABS: 500.0000 mg | ORAL_TABLET | Freq: Two times a day (BID) | ORAL | Status: DC

## 2011-11-03 MED ORDER — OXYCODONE HCL 7.5 MG PO TABS: 7.5000 mg | ORAL_TABLET | ORAL | Status: AC | PRN

## 2011-11-03 NOTE — Progress Notes (Signed)
Resident Co-sign Daily Note: I have seen the patient and reviewed the daily progress note by Lewie Chamber and discussed the care of the patient with them.  See below for documentation of my findings, assessment, and plans.  Subjective: Felling improved today. Thinks rash is getting better. Pain control still sub-optimal  Objective: Vital signs in last 24 hours: Filed Vitals:   11/02/11 0637 11/02/11 1416 11/02/11 2231 11/03/11 0657  BP: 110/73 131/78 124/71 124/75  Pulse: 84 86 85 79  Temp: 98.1 F (36.7 C) 98.1 F (36.7 C) 98.1 F (36.7 C)   TempSrc: Oral Oral Oral   Resp: 20 18 20 20   Height:      Weight:      SpO2: 97% 98% 98% 93%   Physical Exam: General: resting in chair comfortably Skin: Nonblanching, Stellate purpura superimposed on a background of blanchable erythema, lesions are present on the bilateral medial ankles left lower abdomen lateral to the umbilicus, left upper chest and left posterior upper arm. The left posterior upper arm does have a single small 8 mm bulla. OVerall improved today  Lab Results: Reviewed and documented in Electronic Record Micro Results: Reviewed and documented in Electronic Record Studies/Results: Reviewed and documented in Electronic Record Medications: I have reviewed the patient's current medications. Scheduled Meds:    . diphenhydrAMINE  25 mg Oral Once  . docusate sodium  100 mg Oral BID  . 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 Labs so far are consistent, with + ANA.  Will await final ANCA studies and send out of Levamisole level.  I spoke with the Derm consult, who stated that the ED only had sent her pictures and she was not intending to come in. She also suspects it is levamisole,  esp with UDS pos for cocaine. RF, HIV and hepatitis panel negative. Positvie p-anca and negative c-ANCA screen.  Given rapidity of resolution of lesions, will hold off on biopsy at this time -- await final results of c-ANCA -- await levamisole level -- oxy IR 7.5 Q4H -- will add NSAIDs today. 1 dose ketorolac IV followed by ibuprofen PO after d/c  Dispo: home today if foot feels better with NSAIDs.      LOS: 2 days   Monica Zahler, [place] 11/03/2011, 1:35 PM

## 2011-11-03 NOTE — Progress Notes (Signed)
Internal Medicine Attending  Date: 11/03/2011  Patient name: Bruce Vaughn Medical record number: [id number] Date of birth: 04/08/1971 Age: 41 y.o. Gender: male  I saw and evaluated the patient. I reviewed the resident's note by Dr. Candy Sledge and I agree with the resident's findings and plans as documented in his progress note.  Bruce Vaughn was seen on rounds this AM.  He continues to note right big toe pain.  On exam there is continued erythema, mild warmth, and tenderness over the area.  Will begin NSAID therapy.  Agree with D/C home today with follow-up in the Internal Medicine Clinic to review the results of the currently pending labs.

## 2011-11-03 NOTE — Progress Notes (Signed)
Pt c/o of pain and itching on his ear lobes at the site of the rash,Dr K. Schooler(on call)paged and notified,ordered a one time dose of benadryl 25mg  orally,same given at 2320,pt reassured,will however continue to monitor. Obasogie-Asidi, Audrina Marten Efe

## 2011-11-03 NOTE — Discharge Instructions (Signed)
You were treated for a rash that you developed over the past several weeks and a pain in your right foot that radiated up to your right knee. Since your admission your rash had began to spontaneously resolve, so the decision was made to not obtain a skin biopsy. Your lab results for levamisole will be followed up with you when you return to the clinic. You will be given some medication to help with pain and the rashes should continue to heal with time.  #1) Rash - Your rash is most likely induced by levamisole, a common additive to cocaine used for dilution. Rashes such as what you have experienced are a very common side effect to toxicity from this substance. As you body eliminates this substance your rashes will improve. We also have lab tests pending to help [place] understand if your rash is due to another cause such as an autoimmune vasculitis. We will follow up on these tests at your clinic appointment. If you have problems between now and then, please follow up with [place].   #2) Pain - You will be given medication for your foot/leg pain. If your pain is not well controlled please let us know or we can adjust your medication as needed at your follow-up appointment.  Cocaine Abuse PROBLEMS FROM USING COCAINE:   Highly addictive.   Illegal.   Risk of sudden death.   Heart disease.   Irregular heart beat.   High blood pressure.   Damage to nose and lungs.   Severe agitation.   Hallucinations.   Violent behavior.   Paranoia.   Sexual dysfunction.  Most cocaine users deny that they have a problem with addiction. The biggest problem is admitting that you are dependent on cocaine. Those trying to quit using it may experience depression and withdrawal symptoms. Other withdrawal symptoms include fatigue, suicidal thoughts, sleepiness, restlessness, anxiety, and increased craving for cocaine. There are medications available to help prevent depression associated with stopping cocaine. Most users  will find a support group or treatment program helpful in coming off and staying off cocaine. The best chance to cure cocaine addiction is to go into group therapy and to be in a drug-free environment. It is very important to develop healthy relationships and avoid socializing with people who use or deal drugs. Eat well, and give your body the proper rest and healthy exercise it needs. You may need medication to help treat withdrawal symptoms. Call your caregiver or a drug treatment center for more help.  You may also want to call the [facility] on Drug Abuse at 800-662-HELP in the [place] SEEK IMMEDIATE MEDICAL CARE IF:  You develop severe chest pain.   You develop shortness of breath.   You develop extreme agitation.  Document Released: 06/29/2004 Document Revised: 05/11/2011 Document Reviewed: 03/24/2009 [facility] Patient Information 2012 [place], [place].

## 2011-11-03 NOTE — Progress Notes (Signed)
INFECTIOUS DISEASE PROGRESS NOTE  ID: Bruce Vaughn is a 41 y.o. male with   Principal Problem:  *Purpura Active Problems:  Asthma  Subjective: Feels better, rash improving  Abtx:  Anti-infectives    None      Medications:  Scheduled:   . diphenhydrAMINE  25 mg Oral Once  . docusate sodium  100 mg Oral BID    Objective: Vital signs in last 24 hours: Temp:  [98.1 F (36.7 C)] 98.1 F (36.7 C) (05/30 2231) Pulse Rate:  [79-86] 79  (05/31 0657) Resp:  [18-20] 20  (05/31 0657) BP: (124-131)/(71-78) 124/75 mmHg (05/31 0657) SpO2:  [93 %-98 %] 93 % (05/31 0657)   General appearance: alert, cooperative and no distress Resp: clear to auscultation bilaterally Cardio: regular rate and rhythm GI: normal findings: bowel sounds normal and soft, non-tender Skin: areas of erythema, blistering improving.   Lab Results  Basename 11/02/11 0545 11/01/11 1256  WBC 5.9 5.4  HGB 13.4 13.5  HCT 40.0 39.7  NA 136 135  K 4.1 3.8  CL 100 101  CO2 26 24  BUN 10 14  CREATININE 0.76 0.66  GLU -- --   Liver Panel No results found for this basename: PROT:2,ALBUMIN:2,AST:2,ALT:2,ALKPHOS:2,BILITOT:2,BILIDIR:2,IBILI:2 in the last 72 hours Sedimentation Rate No results found for this basename: ESRSEDRATE in the last 72 hours C-Reactive Protein No results found for this basename: CRP:2 in the last 72 hours  Microbiology: Recent Results (from the past 240 hour(s))  CULTURE, BLOOD (ROUTINE X 2)     Status: Normal (Preliminary result)   Collection Time   11/01/11  2:55 PM      Component Value Range Status Comment   Specimen Description BLOOD ARM LEFT   Final    Special Requests BOTTLES DRAWN AEROBIC AND ANAEROBIC 10CC   Final    Culture  Setup Time [id number]   Final    Culture     Final    Value:        BLOOD CULTURE RECEIVED NO GROWTH TO DATE CULTURE WILL BE HELD FOR 5 DAYS BEFORE ISSUING A FINAL NEGATIVE REPORT   Report Status PENDING   Incomplete   CULTURE, BLOOD (ROUTINE X  2)     Status: Normal (Preliminary result)   Collection Time   11/01/11  3:02 PM      Component Value Range Status Comment   Specimen Description BLOOD HAND LEFT   Final    Special Requests BOTTLES DRAWN AEROBIC ONLY [facility]   Final    Culture  Setup Time [id number]   Final    Culture     Final    Value:        BLOOD CULTURE RECEIVED NO GROWTH TO DATE CULTURE WILL BE HELD FOR 5 DAYS BEFORE ISSUING A FINAL NEGATIVE REPORT   Report Status PENDING   Incomplete     Studies/Results: No results found.   Assessment/Plan: ? lavimisole toxicity Has borderline ANA D/c isolation, probable d/c home  [name] Infectious Diseases [phone] 11/03/2011, 11:58 AM   LOS: 2 days

## 2011-11-03 NOTE — Progress Notes (Signed)
Patient was discharged earlier today, and called back saying that the oxycodone he been prescribed could not be filled at [facility]. Indeed, [facility] not [facility] 7.5 mg tabs of oxycodone. This prescription could not be reconciled over the phone because of the controlled substance. Because it is after hours, it is our policy not to refill narcotic medicines. Consequently, I spoke with the patient over the telephone and we decided that I would call in a prescription for Vicodin, which I did: 5/500 mg quantity 20.

## 2011-11-03 NOTE — Progress Notes (Signed)
Medical Student Daily Progress Note  Subjective: Patient slept very well overnight. He received some Benadryl before bed last night 2/2 to his ear itching. He reports his rashes seem to be improving and his right 1st MTP is not as tender as yesterday either and his plantar surface is also much less tender (able to bear weight now). He does note that his right foot pain radiates up to just above his knee and he describes it as aching in nature. He is resting in bed comfortably upon exam.  Objective: Vital signs in last 24 hours: Filed Vitals:   11/02/11 0637 11/02/11 1416 11/02/11 2231 11/03/11 0657  BP: 110/73 131/78 124/71 124/75  Pulse: 84 86 85 79  Temp: 98.1 F (36.7 C) 98.1 F (36.7 C) 98.1 F (36.7 C)   TempSrc: Oral Oral Oral   Resp: 20 18 20 20   Height:      Weight:      SpO2: 97% 98% 98% 93%   Weight change:   Intake/Output Summary (Last 24 hours) at 11/03/11 0816 Last data filed at 11/02/11 1700  Gross per 24 hour  Intake    480 ml  Output      0 ml  Net    480 ml   Physical Exam: BP 124/75  Pulse 79  Temp(Src) 98.1 F (36.7 C) (Oral)  Resp 20  Ht 6' (1.829 m)  Wt 97.886 kg (215 lb 12.8 oz)  BMI 29.27 kg/m2  SpO2 93% General appearance: alert, cooperative and no distress Head: Normocephalic, without obvious abnormality, atraumatic Neck: no adenopathy, no carotid bruit, no JVD, supple, symmetrical, trachea midline and thyroid not enlarged, symmetric, no tenderness/mass/nodules Back: symmetric, no curvature. ROM normal. No CVA tenderness. Lungs: clear to auscultation bilaterally Chest wall: no tenderness Heart: regular rate and rhythm, S1, S2 normal, no murmur, click, rub or gallop Abdomen: soft, non-tender; bowel sounds normal; no masses,  no organomegaly Extremities: extremities normal, atraumatic, no cyanosis or edema Skin: B/L leg rash (1-2 cm purpuric lesion with surrounding erythema), B/L medial thigh ( 64mm purpura), L tricep purpuric lesion with  erythema, L flank purpuric lesion with erythema, periumbilical rash with erythema Lab Results: Basic Metabolic Panel:  Lab 25/95/63 0545 11/01/11 1256  NA 136 135  K 4.1 3.8  CL 100 101  CO2 26 24  GLUCOSE 103* 103*  BUN 10 14  CREATININE 0.76 0.66  CALCIUM 8.8 8.7  MG -- --  PHOS -- --   Liver Function Tests: No results found for this basename: AST:2,ALT:2,ALKPHOS:2,BILITOT:2,PROT:2,ALBUMIN:2 in the last 168 hours No results found for this basename: LIPASE:2,AMYLASE:2 in the last 168 hours No results found for this basename: AMMONIA:2 in the last 168 hours CBC:  Lab 11/02/11 0545 11/01/11 1256  WBC 5.9 5.4  NEUTROABS -- 1.7  HGB 13.4 13.5  HCT 40.0 39.7  MCV 77.4* 76.9*  PLT 155 159   Cardiac Enzymes: No results found for this basename: CKTOTAL:3,CKMB:3,CKMBINDEX:3,TROPONINI:3 in the last 168 hours BNP: No results found for this basename: PROBNP:3 in the last 168 hours D-Dimer: No results found for this basename: DDIMER:2 in the last 168 hours CBG: No results found for this basename: GLUCAP:6 in the last 168 hours Hemoglobin A1C: No results found for this basename: HGBA1C in the last 168 hours Fasting Lipid Panel: No results found for this basename: CHOL,HDL,LDLCALC,TRIG,CHOLHDL,LDLDIRECT in the last 168 hours Thyroid Function Tests: No results found for this basename: TSH,T4TOTAL,FREET4,T3FREE,THYROIDAB in the last 168 hours Coagulation:  Lab 11/01/11 1843  LABPROT  13.8  INR 1.04   Anemia Panel: No results found for this basename: VITAMINB12,FOLATE,FERRITIN,TIBC,IRON,RETICCTPCT in the last 168 hours Urine Drug Screen: Drugs of Abuse     Component Value Date/Time   LABOPIA NONE DETECTED 11/01/2011 1356   COCAINSCRNUR POSITIVE* 11/01/2011 1356   LABBENZ NONE DETECTED 11/01/2011 1356   AMPHETMU NONE DETECTED 11/01/2011 1356   THCU POSITIVE* 11/01/2011 1356   LABBARB NONE DETECTED 11/01/2011 1356    Alcohol Level: No results found for this basename: ETH:2 in  the last 168 hours Urinalysis:  Lab 11/01/11 [place] 1.025  PHURINE 6.0  [place] NEGATIVE  UROBILINOGEN 1.0  NITRITE NEGATIVE  LEUKOCYTESUR NEGATIVE    Micro Results: Recent Results (from the past 240 hour(s))  CULTURE, BLOOD (ROUTINE X 2)     Status: Normal (Preliminary result)   Collection Time   11/01/11  2:55 PM      Component Value Range Status Comment   Specimen Description BLOOD ARM LEFT   Final    Special Requests BOTTLES DRAWN AEROBIC AND ANAEROBIC 10CC   Final    Culture  Setup Time [id number]   Final    Culture     Final    Value:        BLOOD CULTURE RECEIVED NO GROWTH TO DATE CULTURE WILL BE HELD FOR 5 DAYS BEFORE ISSUING A FINAL NEGATIVE REPORT   Report Status PENDING   Incomplete   CULTURE, BLOOD (ROUTINE X 2)     Status: Normal (Preliminary result)   Collection Time   11/01/11  3:02 PM      Component Value Range Status Comment   Specimen Description BLOOD HAND LEFT   Final    Special Requests BOTTLES DRAWN AEROBIC ONLY [facility]   Final    Culture  Setup Time [id number]   Final    Culture     Final    Value:        BLOOD CULTURE RECEIVED NO GROWTH TO DATE CULTURE WILL BE HELD FOR 5 DAYS BEFORE ISSUING A FINAL NEGATIVE REPORT   Report Status PENDING   Incomplete    Studies/Results: No results found. Medications: I have reviewed the patient's current medications. NOM:[id number], acetaminophen, albuterol, oxyCODONE, polyethylene glycol, promethazine, traZODone, DISCONTD: oxyCODONE Scheduled Meds:   . diphenhydrAMINE  25 mg Oral Once  . docusate sodium  100 mg Oral BID   Continuous Infusions:  PRN Meds:.acetaminophen, acetaminophen, albuterol, oxyCODONE, polyethylene glycol, promethazine, traZODone, DISCONTD: oxyCODONE Assessment/Plan:  Rash - Patient has no new rashes. His current rashes are beginning to resolve, esp around his umbilicus. ANA is  positive (low pos), HIV neg, RF neg, and Hep panel Neg. Differential includes Levamisole vasculitis still highly likely vs. PAN (only meets 2 ACR criteria),  vs. other differential items much less likely now though still a possibility (erythema multiforme minor, environmental, SLE). - levamisole urine still pending - c-ANCA NEGATIVE; p-ANCA POSITIVE - will hold off on skin punch biopsy in light of resolving rashes - he will have close follow-up in clinic after discharge to review his results  Right foot pain - 1st MTP is improved today. Mildly warm and TTT. No h/o gout. Plantar surface minimally tender today. Pain radiates up leg to just above knee. - resolving, will continue to monitor - will give ketorolac IV x 1 today - will send home with rx for NSAID for pain  Asthma - Stable. - Albuterol PRN  Disposition - He is stable for discharge today with close follow-up in clinic.   LOS: 2 days   This is a Psychologist, occupational Note.  The care of the patient was discussed with Dr. Quentin Ore and the assessment and plan formulated with their assistance.  Please see their attached note for official documentation of the daily encounter.  Bruce Vaughn 11/03/2011, 8:16 AM

## 2011-11-06 NOTE — Care Management Note (Signed)
    Page 1 of 1   11/06/2011     8:46:23 AM   CARE MANAGEMENT NOTE 11/06/2011  Patient:  Bruce Vaughn, Bruce Vaughn   Account Number:  [id number]  Date Initiated:  11/02/2011  Documentation initiated by:  [facility]  Subjective/Objective Assessment:   Admitted with rash.     Action/Plan:   CSW referral for substance abuse   Anticipated DC Date:  11/05/2011   Anticipated DC Plan:  HOME/SELF CARE  In-house referral  Clinical Social Worker      DC Planning Services  CM consult      Choice offered to / List presented to:             Status of service:  Completed, signed off Medicare Important Message given?   (If response is "NO", the following Medicare IM given date fields will be blank) Date Medicare IM given:   Date Additional Medicare IM given:    Discharge Disposition:  HOME/SELF CARE  Per UR Regulation:  Reviewed for med. necessity/level of care/duration of stay  If discussed at Long Length of Stay Meetings, dates discussed:    Comments:

## 2011-11-06 NOTE — Discharge Summary (Signed)
[facility] Discharge Note  Name: Bruce Vaughn MRN: [id number] DOB: 1970-10-20 41 y.o.  Date of Admission: 11/01/2011  9:46 AM Date of Discharge: 11/03/11 Attending Physician: Mariana Arn  Discharge Diagnosis: Principal Problem:  *Purpura Active Problems:  Asthma  GERD (gastroesophageal reflux disease)   Discharge Medications: Medication List  As of 11/06/2011  6:12 PM   TAKE these medications         albuterol 108 (90 BASE) MCG/ACT inhaler   Commonly known as: PROVENTIL HFA;VENTOLIN HFA   Inhale 2 puffs into the lungs every 6 (six) hours as needed. For wheezing      naproxen 500 MG tablet   Commonly known as: NAPROSYN   Take 1 tablet (500 mg total) by mouth 2 (two) times daily with a meal.      omeprazole 40 MG capsule   Commonly known as: PRILOSEC   Take 1 capsule (40 mg total) by mouth daily. You can also buy omeprazole over-the-counter at [employer], [employer] or [employer] if that is cheaper. Just be sure to take 40 mg per day      OxyCODONE HCl 7.5 MG Tabs   Take 7.5 mg by mouth every 4 (four) hours as needed for pain.            Disposition and follow-up:   Bruce Vaughn was discharged from [facility] in Good condition.  His rash was rapidly resolving and he was feeling much better. His pain was well controlled with NSAIDs and oxycodone.    FOLLOW UP APPOINTMENTS Follow-up Information    Follow up with Quentin Ore, MD on 11/20/2011. (Appointment time 11:15 am)    Contact information:   [address] [place] [address] [phone]           Follow-up Appointments: Discharge Orders    Future Appointments: Provider: Department: Dept Phone: Center:   11/20/2011 11:15 AM Duwaine Maxin, MD [facility] [phone] [facility]     Future Orders Please Complete By Expires   Diet - low sodium heart healthy      Increase activity slowly      Driving Restrictions      Comments:   You cannot drive when taking oxycodone.   Call MD for:  temperature >100.4      Call MD for:  persistant nausea and vomiting      Call MD for:  severe uncontrolled pain      Call MD for:  redness, tenderness, or signs of infection (pain, swelling, redness, odor or green/yellow discharge around incision site)      Call MD for:  difficulty breathing, headache or visual disturbances      Call MD for:  hives      Call MD for:  persistant dizziness or light-headedness      Call MD for:  extreme fatigue      Call MD for:      Scheduling Instructions:   Please call our clinic if you're experiencing problems. We will attempt to have you be seen that day.      At his followup appointment with Dr. Candy Sledge, The following item should be addressed: #1 Follow up levamisole level #2 If levamisole level is negative, consider a rheumatology consult #3 If new lesions arise, consider punch biopsy in clinic  Consultations:  Infectious disease  Procedures Performed:  Dg Finger Thumb Left  10/13/2011  *RADIOLOGY REPORT*  Clinical Data: Abscess, cellulitis  LEFT  THUMB 2+V  Comparison: None.  Findings: Three views of the left thumb submitted.  No acute fracture or subluxation.  No periosteal reaction or bony erosion. Soft tissue swelling is noted adjacent to metacarpal phalangeal joint.  No radiopaque foreign body.  IMPRESSION: No acute fracture or subluxation.  Soft tissue swelling adjacent to metacarpal phalangeal joint.  No radiopaque foreign body.  No periosteal reaction or bony erosion.  Original Report Authenticated By: Natasha Mead, M.D.    Admission HPI: Bruce Vaughn is a 41 y.o.male with past medical history significant for rash 6 years ago in response to a drug who presents with painful purpuric rash for 9 days.  History was gathered from the patient, his wife and the medical record as the patient and his wife were unsure of the dates and findings associated with his multiple visits.  On May 30 the  patient was walking in his house and fractured his toe by hitting it on a table. He came to the ED where he had lidocaine injection in the toe was buddy taped. Approximately 3 days later on May 2, he again presented to the ED with a red, swollen fifth toe with a red streak to his ankle. The area is warm and tender and he was diagnosed with cellulitis. He was placed on a course of Keflex. On May 6 he again presented to the ED with a chief complaint of sores in his mouth. The ED note states the white plaques over his gums and he was diagnosed with thrush. They discontinued the Keflex as his foot swelling has gone down and he was started on nystatin for thrush. On the 10th the patient noticed a swollen area adjacent to the first MCP joint on his left hand. This also had purulent drainage with a red streak up his arm. Dr Merlyn Lot was called for I&D. The patient received one dose of vancomycin in the ED, was taken to the OR and abscess was drained. Cultures were positive for polymicrobial infection but no strep and no staph were identified. Patient was discharged on a course of Bactrim. He then came to the ED with complaints of a painful erythematous rash over multiple body sites. Marland Kitchen He was evaluated by Dr. Ninetta Lights in the ED who changed his antibiotics to Augmentin and gave him a course of prednisone. After the patient's symptoms did not improve he discontinued all medications 3 days later. He now presents for worsening rash. The rash encompasses his bilateral helices of his ears, the left posterior upper arm, the left flank, his left upper chest, his left lower abdomen, the shaft of his penis, and the bilateral medial ankles. The rash is painful with a sharp stinging character. Not itchy. Does not weep. He states it starts off as a ink area then becomes dark red before coming purple. He states the rash precedes pain. He also endorses pain on the bottom of his right foot that is Constant and sharp, 7/10 currently, but peaked  at 10/10. Some radiation up the leg. Improved with pain medication in the ED. He did not take anything at home. Pain is improved also with elevation. Pain is worse with dependency and pressure. No fevers, sweats, nausea, vomiting, headaches, vision changes. Some chronic cough from smoking   Admission Physical Exam and Labs: Physical Exam:  Blood pressure 118/60, pulse 82, temperature 98.9 F (37.2 C), temperature source Oral, resp. rate 19, height 6' (1.829 m), weight 215 lb 12.8 oz (97.886 kg), SpO2 99.00%.  Gen:  Well-developed, well-nourished male in no acute distress; alert, appropriate and cooperative throughout examination. Head: Normocephalic, atraumatic. Eyes: PERRL, EOMI, No signs of anemia or jaundince.no mucocutaneous lesions observed  Nose: Mucous membranes moist, not inflammed, nonerythematous. No mucocutaneous lesions observed  Throat: Oropharynx nonerythematous, no exudate appreciated. No mucocutaneous lesions observed  Neck: Supple with no deformities, masses, or tenderness noted. No carotid Bruits, no JVD. Lungs: Normal respiratory effort. Clear to auscultation BL, without crackles or wheezes. Heart: RRR. S1 and S2 normal without murmur, gallop,or rubs. Abdomen: BS normoactive. Soft, nondistended, non-tender. No masses or organomegaly. Extremities: No pretibial edema. Neurologic: A&O X3, CN II - XII are grossly intact. Motor strength is 5/5 in the all 4 extremities, Sensations intact to light touch. No focal neurologic deficit Skin: Nonblanching, Stellate purpura superimposed on a background of blanchable erythema, lesions are present on the bilateral medial ankles left lower abdomen lateral to the umbilicus, left upper chest and left posterior upper arm. The left posterior upper arm does have a single small 8 mm bulla. There is mild erythema to the left side of the penile shaft.  Psych: mood and affect are normal.  Lab results:  Basic Metabolic Panel:   Basename  11/01/11 1256    NA  135   K  3.8   CL  101   CO2  24   GLUCOSE  103*   BUN  14   CREATININE  0.66   CALCIUM  8.7    CBC:   Basename  11/01/11 1256   WBC  5.4   NEUTROABS  1.7   HGB  13.5   HCT  39.7   MCV  76.9*   PLT  159    Coagulation:   Basename  11/01/11 1843   LABPROT  13.8   INR  1.04    Urine Drug Screen:  Drugs of Abuse    Component  Value  Date/Time    LABOPIA  NONE DETECTED  11/01/2011 1356    COCAINSCRNUR  POSITIVE*  11/01/2011 1356    LABBENZ  NONE DETECTED  11/01/2011 1356    AMPHETMU  NONE DETECTED  11/01/2011 1356    THCU  POSITIVE*  11/01/2011 1356    LABBARB  NONE DETECTED  11/01/2011 1356    Urinalysis:  Results for Bruce Vaughn, Bruce Vaughn (MRN [id number]) as of 11/01/2011 21:09   Ref. Range  11/01/2011 13:56   Color, Urine  Latest Range: YELLOW  YELLOW   APPearance  Latest Range: CLEAR  CLEAR   Specific Gravity, Urine  Latest Range: 1.005-1.030  1.025   pH  Latest Range: 5.0-8.0  6.0   Glucose, UA  Latest Range: NEGATIVE mg/dL  NEGATIVE   Bilirubin Urine  Latest Range: NEGATIVE  NEGATIVE   Ketones, ur  Latest Range: NEGATIVE mg/dL  NEGATIVE   Protein  Latest Range: NEGATIVE mg/dL  NEGATIVE   Urobilinogen, UA  Latest Range: 0.0-1.0 mg/dL  1.0   Nitrite  Latest Range: NEGATIVE  NEGATIVE   Leukocytes, UA  Latest Range: NEGATIVE  NEGATIVE   Hgb urine dipstick  Latest Range: NEGATIVE  NEGATIVE    Misc. Labs:  Rheumatoid factor negative  Hospital Course by problem list: Principal Problem:  *Purpura - The patient was admitted for workup of his purpura.  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   I spoke with the Dermatolgy consult physician, who stated that the ED sent her pictures and even prior  to positive urine cocaine test, her primary diagnosis was levamisole vasculopathy.  ANA and p ANCA were both positive while C-ANCA was negative. At discharge urine test send out of Levamisole level  was pending. RF, HIV and hepatitis panel were all negative. The patient had very rapid resolution of his lesions and by HD 2 they were markedly improved.  He was discharged on oxycodone and naproxen for pain relief. The patient was discharged with follow-up in the [facility] in 2 weeks, which should allow his results to come back.  If levamisole is not detected the next most likely cause of purpura would be vasculitis.   Active Problems:  Asthma - patient had no complications with asthma during this hospitalization.    GERD (gastroesophageal reflux disease) - Mr Bruce Vaughn states that he has GERD and is supposed to be on a PPI.  He was provided a prescription for omeprazole at discharge.   Discharge Vitals:  BP 110/69  Pulse 63  Temp(Src) 97.9 F (36.6 C) (Oral)  Resp 20  Ht 6' (1.829 m)  Wt 215 lb 12.8 oz (97.886 kg)  BMI 29.27 kg/m2  SpO2 98%  Discharge Labs:  Lab Results  Component Value Date   CREATININE 0.76 11/02/2011   BUN 10 11/02/2011   NA 136 11/02/2011   K 4.1 11/02/2011   CL 100 11/02/2011   CO2 26 11/02/2011   Lab Results  Component Value Date   WBC 5.9 11/02/2011   HGB 13.4 11/02/2011   HCT 40.0 11/02/2011   MCV 77.4* 11/02/2011   PLT 155 11/02/2011   Results for Bruce Vaughn, Bruce Vaughn (MRN [id number]) as of 11/06/2011 22:33  Ref. Range 11/01/2011 14:51  ANA Latest Range: NEGATIVE  POSITIVE (A)  ANA Pattern 1 No range found SPECKLED  ANA Titer 1 Latest Range: <1:40  1:40 (H)  Rheumatoid Factor Latest Range: <=14 IU/mL <10  c-ANCA Screen Latest Range: NEGATIVE  NEGATIVE  p-ANCA Screen Latest Range: NEGATIVE  POSITIVE (A)  P-ANCA Latest Range: <1:20  1:640 (H)  Atypical p-ANCA Screen Latest Range: NEGATIVE  NEGATIVE    Results for Bruce Vaughn, Bruce Vaughn (MRN [id number]) as of 11/06/2011 22:33  Ref. Range 11/01/2011 14:51  Hep A IgM Latest Range: NEGATIVE  NEGATIVE  Hepatitis B Surface Ag Latest Range: NEGATIVE  NEGATIVE  Hep B C IgM Latest Range: NEGATIVE  NEGATIVE  HCV Ab Latest Range:  NEGATIVE  NEGATIVE  HIV 1 RNA Quant Latest Range: <20 copies/mL <20  HIV1 RNA Quant, Log Latest Range: <1.30 log 10 <1.30  HIV Latest Range: NON REACTIVE  NON REACTIVE   Time spent in discharge (includes decision making & examination of pt): greater than 35 minutes   Signed: D'Arcy Vaughn, Bruce Vaughn 11/06/2011, 6:12 PM

## 2011-11-06 NOTE — ED Provider Notes (Addendum)
Skin: patient has rash with large areas erythematous macules without elevation that are TTP with central purpuric areas noted on the trunk, extremities, and even ears  Medical screening examination/treatment/procedure(s) were conducted as a shared visit with non-physician practitioner(s) and myself.  I personally evaluated the patient during the encounter  Cyndra Numbers, MD 11/06/11 1610  Cyndra Numbers, MD 11/06/11 [phone]

## 2011-11-07 LAB — CULTURE, BLOOD (ROUTINE X 2): Culture: NO GROWTH

## 2011-11-20 ENCOUNTER — Ambulatory Visit (INDEPENDENT_AMBULATORY_CARE_PROVIDER_SITE_OTHER): Payer: Self-pay | Admitting: Internal Medicine

## 2011-11-20 ENCOUNTER — Encounter: Payer: Self-pay | Admitting: Internal Medicine

## 2011-11-20 VITALS — BP 119/81 | HR 78 | Temp 97.0°F | Ht 72.0 in | Wt 219.7 lb

## 2011-11-20 DIAGNOSIS — K219 Gastro-esophageal reflux disease without esophagitis: Secondary | ICD-10-CM

## 2011-11-20 DIAGNOSIS — D692 Other nonthrombocytopenic purpura: Secondary | ICD-10-CM

## 2011-11-20 LAB — URIC ACID: Uric Acid, Serum: 3.8 mg/dL — ABNORMAL LOW (ref 4.0–7.8)

## 2011-11-20 MED ORDER — OMEPRAZOLE 40 MG PO CPDR: 40.0000 mg | DELAYED_RELEASE_CAPSULE | Freq: Every day | ORAL | Status: DC

## 2011-11-20 NOTE — Patient Instructions (Addendum)
Please continue to take your omeprazole every day.  You can take naproxen twice daily. Please take this medicine with food.   We have made you a referral to rheumatology to have your arthritis and vasculitis evaluated. They will call you to set up the appointment.  If any of your lab results are abnormal we will contact you by phone or send you a letter. If they are normal, we will not contact you, but will be happy to discuss them at your next clinic appointment.  Return to clinic to see your new PCP in 1 year for a physical exam. If you need to see a doctor in the meantime, please call our office to be seen.  For emergencies, the ED is the place to go, but for nearly everything else we will be able to see you.  Please bring all your medications to your next clinic appointment.

## 2011-11-20 NOTE — Progress Notes (Signed)
Subjective:     Patient ID: Bruce Vaughn, male   DOB: March 13, 1971, 41 y.o.   MRN: [id number]   Bruce Vaughn is a 41 year old man with past medical history significant for asthma and GERD who presents for hospital followup regarding a vasculitis for which she was admitted from May 29 through May 30.  Purpura: Overall Bruce Vaughn states his purpura and skin lesions are much improved. He had no new skin lesions. He is not taking any medication since discharge. He is relieved to hear that the levamisole was negative. He denies any further cocaine use.  Arthritis Presents for initial visit. The disease course has been stable. He complains of pain, stiffness and joint swelling. Affected locations include the left elbow, right elbow, right ankle and left ankle. His pain is at a severity of 0/10. Associated symptoms include dry eyes, pain at night and rash. Pertinent negatives include no diarrhea, dry mouth, dysuria, fatigue, fever, pain while resting or weight loss. He shows no family history of lupus, family history of rheumatoid arthritis or family history of unspecified arthritis. Past treatments include NSAIDs. The treatment provided moderate relief. Factors aggravating his arthritis include activity.   he states that his symptoms are worst in the evening. He generally wakes up feeling well and takes less than 30 minutes to get moving in the morning. He states he works approximately 12 hours a day and feels that his arthralgias are worse after he gets home. He states his last episodes of arthralgias were yesterday mainly in his bilateral elbows left greater than right and bilateral ankles right greater than left. He denies any erythema or swelling. Pain is well controlled with naproxen.   Review of Systems  Constitutional: Negative for fever, weight loss and fatigue.  HENT: Negative for nosebleeds and congestion.   Eyes: Negative for photophobia, pain, redness and visual disturbance.  Respiratory:  Negative for chest tightness and shortness of breath.   Cardiovascular: Negative for chest pain, palpitations and leg swelling.  Gastrointestinal: Negative for abdominal pain, diarrhea and blood in stool.  Genitourinary: Negative for dysuria, discharge and difficulty urinating.  Musculoskeletal: Positive for joint swelling, arthralgias, arthritis and stiffness. Negative for myalgias.  Skin: Positive for rash.  Neurological: Negative for weakness and numbness.  Psychiatric/Behavioral: Negative for confusion.   Past Medical History  Diagnosis Date  . Asthma    Family History  Problem Relation Age of Onset  . Diabetes Mother   . Diabetes Maternal Grandmother   . Allergies Daughter     mold allergy, treatment x 1 year   History   Social History  . Marital Status: Single    Spouse Name: N/A    Number of Children: 5  . Years of Education: 11th grade   Occupational History  . 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 Smoking status: Current Everyday Smoker -- 1.0 packs/day for 38 years    Types: Cigarettes  . Smokeless tobacco: Not on file  . Alcohol Use: 0.0 oz/week    .1 drink(s) per week  . Drug Use: 1 per week    Special: Marijuana  . Sexually Active: Not on file   Other Topics Concern  . Not on file   Social History Narrative   Has  5 kids. Cleans pools. Daughter has chronic urticaria (?allergic to mold).      Objective:       Physical  Exam: Filed Vitals:   11/20/11 1126  BP: 119/81  Pulse: 78  Temp: 97 F (36.1 C)  TempSrc: Oral  Height: 6' (1.829 m)  Weight: 219 lb 11.2 oz (99.655 kg)  SpO2: 98%     Physical Exam  Constitutional: He appears well-developed and well-nourished. No distress.  HENT:       No nasopharyngeal epistaxis or granulation seen  Eyes: Conjunctivae are normal. Pupils are equal, round, and reactive to light. Right eye exhibits no discharge. Left eye exhibits no discharge.   Cardiovascular: Normal rate, regular rhythm and normal heart sounds.   Pulmonary/Chest: Effort normal and breath sounds normal. No respiratory distress.  Musculoskeletal: He exhibits no edema.  Neurological: He is alert. No cranial nerve deficit.  Skin: Skin is warm and dry. No rash noted. He is not diaphoretic. There is erythema. No pallor.       On the helices of both ears are well healing crusted scars and ulcers. There is no erythema or drainage or sign of active inflammation. Similarly, on the posterior surfaces of feet bilateral upper arms there is some superficial erythema consistent with new skin growth and healing ulcers. There is also faint erythema over the surface of the lower abdomen and medial malleoli bilaterally.  Psychiatric: He has a normal mood and affect.   No erythema, warmth, swelling, tenderness present at the shoulder, elbow, wrist, knee, ankle joints.   Results for LYON, DUMONT (MRN [id number]) as of 11/20/2011 14:50  Ref. Range 11/01/2011 14:51  ANA Latest Range: NEGATIVE  POSITIVE (A)  ANA Pattern 1 No range found SPECKLED  ANA Titer 1 Latest Range: <1:40  1:40 (H)  Rheumatoid Factor Latest Range: <=14 IU/mL <10  c-ANCA Screen Latest Range: NEGATIVE  NEGATIVE  p-ANCA Screen Latest Range: NEGATIVE  POSITIVE (A)  P-ANCA Latest Range: <1:20  1:640 (H)  Atypical p-ANCA Screen Latest Range: NEGATIVE  NEGATIVE   Lab Results  Component Value Date   CREATININE 0.76 11/02/2011   BUN 10 11/02/2011   NA 136 11/02/2011   K 4.1 11/02/2011   CL 100 11/02/2011   CO2 26 11/02/2011   Levamisole urine - NEGATIVE       Assessment:     Case discussed with Dr. Rogelia Boga. Please see problem oriented charting for assessment and plan by problem (best viewed under encounters tab).

## 2011-11-20 NOTE — Assessment & Plan Note (Addendum)
Patient's GERD is actually well controlled. He states he is not having much reflux pain. However he is currently taking a nonsteroidal anti-inflammatory and has not been taking omeprazole regularly. I stressed to him that I wanted him to continue taking omeprazole for as long as he was taking naproxen. I asked him to take this medication daily regardless of if he is having symptoms or not. Prescription for omeprazole given to the patient personally today.

## 2011-11-20 NOTE — Assessment & Plan Note (Signed)
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 At this time etiology is unclear. Differential includes polyangiitis with granulomatosis (Wegener's), she and her Churg-Strauss versus drug-induced vasculitis. Levamisole induced vasculitis is less likely given negative urine screen for levamisole.  Will test myeloperoxidase and proteinase 3 antibodies today to evaluate for possible Wegener's. Will not recheck p-ANCA as it can remain elevated for 2-12 months following levamisole use. As the patient also has arthritis, we will check uric acid level today. It is likely that his episodes of hot swollen joints are most consistent with gout rather than infectious arthritis. However, his arthritis could b a component of his potential autoimmune disorder. Referral to rheumatology also made today.

## 2011-11-21 LAB — MPO/PR-3 (ANCA) ANTIBODIES: Myeloperoxidase Abs: 13 AU/mL (ref <20)

## 2011-11-22 ENCOUNTER — Encounter: Payer: Self-pay | Admitting: Internal Medicine

## 2012-01-03 ENCOUNTER — Emergency Department (HOSPITAL_COMMUNITY)
Admission: EM | Admit: 2012-01-03 | Discharge: 2012-01-04 | Disposition: A | Discharge status: Home / Self Care | Payer: Self-pay | Attending: Emergency Medicine | Admitting: Emergency Medicine

## 2012-01-03 ENCOUNTER — Encounter (HOSPITAL_COMMUNITY): Payer: Self-pay | Admitting: Emergency Medicine

## 2012-01-03 DIAGNOSIS — F17200 Nicotine dependence, unspecified, uncomplicated: Secondary | ICD-10-CM | POA: Insufficient documentation

## 2012-01-03 DIAGNOSIS — L03119 Cellulitis of unspecified part of limb: Secondary | ICD-10-CM | POA: Insufficient documentation

## 2012-01-03 DIAGNOSIS — J45909 Unspecified asthma, uncomplicated: Secondary | ICD-10-CM | POA: Insufficient documentation

## 2012-01-03 DIAGNOSIS — L0390 Cellulitis, unspecified: Secondary | ICD-10-CM

## 2012-01-03 DIAGNOSIS — L02619 Cutaneous abscess of unspecified foot: Secondary | ICD-10-CM | POA: Insufficient documentation

## 2012-01-03 MED ORDER — COLCHICINE 0.6 MG PO TABS: 0.6000 mg | ORAL_TABLET | Freq: Every day | ORAL | Status: DC

## 2012-01-03 MED ORDER — HYDROCODONE-ACETAMINOPHEN 5-325 MG PO TABS
1.0000 | ORAL_TABLET | Freq: Once | ORAL | Status: AC
  Administered 2012-01-03: 1 via ORAL
  Filled 2012-01-03: qty 1

## 2012-01-03 MED ORDER — INDOMETHACIN 25 MG PO CAPS: 25.0000 mg | ORAL_CAPSULE | Freq: Three times a day (TID) | ORAL | Status: AC | PRN

## 2012-01-03 MED ORDER — DOXYCYCLINE HYCLATE 100 MG PO CAPS: 100.0000 mg | ORAL_CAPSULE | Freq: Two times a day (BID) | ORAL | Status: DC

## 2012-01-03 MED ORDER — DOXYCYCLINE HYCLATE 100 MG PO TABS
100.0000 mg | ORAL_TABLET | Freq: Once | ORAL | Status: AC
  Administered 2012-01-04: 100 mg via ORAL
  Filled 2012-01-03: qty 1

## 2012-01-03 NOTE — ED Notes (Signed)
Pt with left foot pain, redness and warm to touch at site

## 2012-01-03 NOTE — ED Notes (Signed)
PT. REPORTS LEFT FOOT PAIN WITH REDDNESS  / SMALL ABRASION AT LEFT FOOT ONSET LAST WEEKEND .

## 2012-01-03 NOTE — ED Provider Notes (Signed)
History     CSN: [id number]  Arrival date & time 01/03/12  2038   First MD Initiated Contact with Patient 01/03/12 2254      Chief Complaint  Patient presents with  . Foot Pain   HPI  History provided by the patient. Patient is a 41 year old male with history of asthma who presents with complaints of redness and pain to left ankle. [place] did develop a small scratch and cut over his left foot. Patient states he's been keeping this clean and using topical antibiotic ointment but yesterday developed a redness and pain to the ankle area above the cuff. He denies any erythematous streaks or significant swelling. Pain is worse with movements and palpation over the area. Patient denies any fever, chills or sweats. Patient does report having a recent history of several similar erythematous areas to joints requiring hospitalization a few months ago. Patient states that he was on some antibiotics for this but states that he was told his condition may have been caused from a rheumatology condition and he was given referrals but has not followed up.    Past Medical History  Diagnosis Date  . Asthma     Past Surgical History  Procedure Date  . I&d extremity 10/13/2011    Procedure: IRRIGATION AND DEBRIDEMENT EXTREMITY;  Surgeon: Tami Ribas, MD;  Location: [facility] OR;  Service: Orthopedics;  Laterality: Left;    Family History  Problem Relation Age of Onset  . Diabetes Mother   . Diabetes Maternal Grandmother   . Allergies Daughter     mold allergy, treatment x 1 year    History  Substance Use Topics  . Smoking status: Current Everyday Smoker -- 1.0 packs/day for 38 years    Types: Cigarettes  . Smokeless tobacco: Not on file  . Alcohol Use: 0.0 oz/week    .1 drink(s) per week      Review of Systems  Constitutional: Negative for fever and chills.  Musculoskeletal: Positive for arthralgias.  Skin: Positive for rash.  Neurological:  Negative for headaches.    Allergies  Cephalosporins; Amoxicillin; Augmentin; Bactrim; Clindamycin/lincomycin; Penicillins; and Sulfa antibiotics  Home Medications   Current Outpatient Rx  Name Route Sig Dispense Refill  . ALBUTEROL SULFATE HFA 108 (90 BASE) MCG/ACT IN AERS Inhalation Inhale 2 puffs into the lungs every 6 (six) hours as needed. For wheezing    . OMEPRAZOLE 40 MG PO CPDR Oral Take 1 capsule (40 mg total) by mouth daily. You can also buy omeprazole over-the-counter at target, [employer] or [employer] if that is cheaper. Just be sure to take 40 mg per day 30 capsule 3    BP 138/80  Pulse 86  Temp 98.1 F (36.7 C) (Oral)  Resp 20  SpO2 100%  Physical Exam  Nursing note and vitals reviewed. Constitutional: He is oriented to person, place, and time. He appears well-developed and well-nourished. No distress.  HENT:  Head: Normocephalic and atraumatic.  Cardiovascular: Normal rate and regular rhythm.   Pulmonary/Chest: Effort normal and breath sounds normal.  Abdominal: Soft.  Musculoskeletal: Normal range of motion. He exhibits no edema and no tenderness.  Neurological: He is alert and oriented to person, place, and time.  Skin: Skin is warm. Rash noted.       Small healing superficial wound to the dorsal left foot. There is diffuse erythema and increased warmth to the anterior portion of the left ankle. No erythematous streaks. No significant swelling.  No induration.  Psychiatric: His behavior is normal.    ED Course  Procedures    1. Cellulitis       MDM  10:50PM patient seen and evaluated. Physical exam concerning for cellulitis developing the anterior left ankle and foot.   Patient was seen and evaluated with attending physician. Will place patient on doxycycline given multiple drug allergies and give return precautions. Patient to be followed up in 24-48 hours.     Angus Seller, PA 01/04/12 0001

## 2012-01-03 NOTE — ED Notes (Signed)
41 year old male with a history of several days of gradual onset of redness over the dorsum of the foot. This is gradually getting worse, is a small area that does not extend up the leg and is not causing fever chills nausea or vomiting. It is mildly tender to touch and has some pain with ambulation. He did sustain a scratch to the top of the foot or the redness starts but there is not any swelling or purulent drainage. On exam he has a very small area of redness which is warm to touch of the foot and tenderness. He has no signs of swelling, normal pulses on the foot, otherwise appears benign  We'll treat with doxycycline as he has an extensive allergy list for other antibiotics, patient informed of need for full course of antibiotics and will give resource list for followup.  Medical screening examination/treatment/procedure(s) were conducted as a shared visit with non-physician practitioner(s) and myself.  I personally evaluated the patient during the encounter    Vida Roller, MD 01/03/12 [phone]

## 2012-01-04 NOTE — ED Notes (Signed)
Pt states understanding of discharge instructions 

## 2012-01-04 NOTE — Discharge Instructions (Signed)
You were seen and evaluated for your redness and pain of your ankle. At this time your providers were concerned for possible skin infection. You have been given prescription for antibiotics to use for your infection as well as pain medications. Please take these as prescribed and followup with your primary care provider. You should have a recheck of your symptoms in the next 48 hours to be sure they are improving. Return sooner if you develop any increasing pain, fever, chills or sweats.    Cellulitis Cellulitis is an infection of the skin and the tissue beneath it. The area is typically red and tender. It is caused by germs (bacteria) (usually staph or strep) that enter the body through cuts or sores. Cellulitis most commonly occurs in the arms or lower legs.  HOME CARE INSTRUCTIONS   If you are given a prescription for medications which kill germs (antibiotics), take as directed until finished.   If the infection is on the arm or leg, keep the limb elevated as able.   Use a warm cloth several times per day to relieve pain and encourage healing.   See your caregiver for recheck of the infected site as directed if problems arise.   Only take over-the-counter or prescription medicines for pain, discomfort, or fever as directed by your caregiver.  SEEK MEDICAL CARE IF:   The area of redness (inflammation) is spreading, there are red streaks coming from the infected site, or if a part of the infection begins to turn dark in color.   The joint or bone underneath the infected skin becomes painful after the skin has healed.   The infection returns in the same or another area after it seems to have gone away.   A boil or bump swells up. This may be an abscess.   New, unexplained problems such as pain or fever develop.  SEEK IMMEDIATE MEDICAL CARE IF:   You have a fever.   You or your child feels drowsy or lethargic.   There is vomiting, diarrhea, or lasting discomfort or feeling ill  (malaise) with muscle aches and pains.  MAKE SURE YOU:   Understand these instructions.   Will watch your condition.   Will get help right away if you are not doing well or get worse.  Document Released: 03/01/2005 Document Revised: 05/11/2011 Document Reviewed: 01/08/2008 [facility] Patient Information 2012 [place], [place].     RESOURCE GUIDE  Chronic Pain Problems: Contact [facility]  [phone] Patients need to be referred by their primary care doctor.  Insufficient Money for Medicine: Contact United Way:  call "211" or [facility] [phone].  No Primary Care Doctor: - Call [facility] Connect  [phone] - can help you locate a primary care doctor that  accepts your insurance, provides certain services, etc. - Physician Referral [phone]  Agencies that provide inexpensive medical care: - [facility]  [phone] - [facility]  [phone] - [facility]  [phone] [facility]  [phone] - Planned Parenthood  [phone] [facility]  [phone]  Medicaid-accepting [facility] Providers: - [facility]- [address]  [phone], Mon-Fri 9am-7pm, Sat 9am-1pm - [facility]- [address] [place], [address] [place]  [phone] - [facility]- [address] [place]  [phone] [facility]- [address]  [phone] - Renaye Rakers- [address], [phone]  Only accepts [employer] patients after they have their name  applied to their card  Self Pay (no insurance) in [facility]: - Sickle Cell Patients: Dr Willey Blade, [facility]  [address] [place], [phone] - [facility]- [address] [place]  [phone]       Patrcia Dolly [facility] [place]- [address]       -     [facility]- see information  above (Speak to [employer] if you do not have insurance)       -  [facility]- [address] [place], [phone]       -  [facility]- [address],  [phone]       -  [facility]- [address], [phone]       -  Dr Julio Sicks-  [address], [place], [phone]       -  [facility]- [address] [phone]       -  [facility]- [address] [phone], also [address] [place] [address] [phone]       -    [facility]- [address] [place], [phone], 1st & 3rd Saturday   every month, 10am-1pm  1) Find a Doctor and Pay Out of Pocket Although you won't have to find out who is covered by your insurance plan, it is a good idea to ask around and get recommendations. You will then need to call the office and see if the doctor you have chosen will accept you as a new patient and what types of options they offer for patients who are self-pay. Some doctors offer discounts or will set up payment plans for their patients who do not have insurance, but you will need to ask so you aren't surprised when you get to your appointment.  2) Contact Your Local Health Department Not all health departments have doctors that can see patients for sick visits, but many do, so it is worth a call to see if yours does. If you don't know where your local health department is, you can check in your phone book. The CDC also has a tool to help you locate your state's health department, and many state websites also have listings of all of their local health departments.  3) Find a Walk-in Clinic If your illness is not likely to be very severe or complicated, you may want to try a walk in clinic. These are popping up all over the country in pharmacies, drugstores, and shopping centers. They're usually staffed by nurse practitioners or physician assistants that have been trained to treat common illnesses and complaints. They're usually fairly quick and  inexpensive. However, if you have serious medical issues or chronic medical problems, these are probably not your best option  STD Testing - [facility] Department of [facility], STD Clinic, [address] [place], phone [phone] or [phone].  Monday - Friday, call for an appointment. [facility] Department of [employer], STD Clinic, [address], [place], phone [phone] or [phone].  Monday - Friday, call for an appointment.  Abuse/Neglect: [facility] Child Abuse Hotline 310 081 4009 [facility] Child Abuse Hotline (563)240-9857 (After Hours)  Emergency Shelter:  [facility] (870)[phone]  Maternity Homes: - [facility] [place] [facility] (506)522-1162 - [facility] [phone]  MRSA Hotline #:   (340)561-8580  [facility] Resources  [facility]  United Way [facility] Dept. [address]                 [address]         [address] [place] [address]  Blondell Reveal Phone:  [phone]                                  Phone:  [phone]                   Phone:  [phone]  [facility], [phone] - [facility] - [phone]       -     [facility] in [place], [address],                                  [phone], [facility] Child Abuse Hotline 226-074-6592 or 830-164-0432 (After Hours)   Behavioral Health Services  Substance Abuse Resources: - Alcohol and Drug Services  [phone] - [facility] ([phone] - [facility] ([phone] Floydene Flock [phone] - Residential & Outpatient Substance Abuse Program  [phone]  Psychological Services: [facility]  [phone]  Services  [phone] - [facility], [phone] [place]. [address] [place], ACCESS LINE: 321-857-4994 or 765-855-7896, [employer]za  Dental Assistance  If unable to pay or uninsured, contact:  [facility] or [facility]. to become qualified for the adult dental clinic.  Patients with Medicaid: [facility] [phone] [address], [phone] [address] [phone]  If unable to pay, or uninsured, contact [facility] [phone]) or [facility] Department [phone] in [place], [phone] in [facility]) to become qualified for the adult dental clinic  Other Low-Cost Community Dental Services: - [facility]- [address] [place], [place], [place], [address], [phone], [address], 2nd and 4th Thursday of the month at 6:30am.  10 clients each day by appointment, can sometimes see walk-in patients if someone does not show for an appointment. [facility]- [address] [place], [place], [address], [phone] - [place]- [address] [place], [place], [address], [phone] - [facility]- [phone] [facility]- [phone] [facility] Department- [phone]

## 2012-01-04 NOTE — ED Provider Notes (Signed)
Medical screening examination/treatment/procedure(s) were performed by non-physician practitioner and as supervising physician I was immediately available for consultation/collaboration.  Derwood Kaplan, MD 01/04/12 [phone]

## 2012-01-12 ENCOUNTER — Encounter: Payer: Self-pay | Admitting: Internal Medicine

## 2012-01-12 ENCOUNTER — Ambulatory Visit (INDEPENDENT_AMBULATORY_CARE_PROVIDER_SITE_OTHER): Payer: Self-pay | Admitting: Internal Medicine

## 2012-01-12 VITALS — BP 114/73 | HR 81 | Temp 97.9°F | Ht 71.5 in | Wt 216.4 lb

## 2012-01-12 DIAGNOSIS — M2550 Pain in unspecified joint: Secondary | ICD-10-CM | POA: Insufficient documentation

## 2012-01-12 DIAGNOSIS — M25579 Pain in unspecified ankle and joints of unspecified foot: Secondary | ICD-10-CM

## 2012-01-12 NOTE — Assessment & Plan Note (Addendum)
Patient had been seen in the ED because of left ankle pain. Ankle pain and erythema dissipated without intervention. Patient has history of migratory polyarthritis. He was also hospitalized in May with purpura, thought to be levamisole induced at the time. He denies cocaine abuse at present. 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  At hospital followup in June, he was supposed to bring information for [facility], but he reports he never got the blue paper. Now, we have no rheumatologist available to see him. He will complete his paperwork with Rudell Cobb, and when her rheumatology appointment is available, we will contact him. I've also requested that he contact us in 3-6 months, if he does not hear anything.

## 2012-01-12 NOTE — Progress Notes (Signed)
Subjective:   Patient ID: Bruce Vaughn male   DOB: 09/18/1970 41 y.o.   MRN: [id number]  HPI: Mr.Bruce Vaughn is a 41 y.o.  man with history significant for asthma who presents for emergency room followup. He was seen in the ED on 01/03/2012 for left foot pain. He was diagnosed with cellulitis and given a prescription of doxycycline. He could not afford doxycycline, and that was the only antibiotic that he could take at the time because he has so many antibiotic intolerances. She reports that the pain and redness began to dissipate while he was in the emergency room. He came to the hospital because he was concerned about joint infection, as he has had a left first metacarpal joint infection in the past, which needed surgery. He was also concerned because [place] and had a small cut on his foot from then.  Even without antibiotics, the erythema and warmth completely dissipated and the pain resolved without any further management. Today he denies illicit drug use, including cocaine.   Past Medical History  Diagnosis Date  . Asthma    Current Outpatient Prescriptions  Medication Sig Dispense Refill  . albuterol (PROVENTIL HFA;VENTOLIN HFA) 108 (90 BASE) MCG/ACT inhaler Inhale 2 puffs into the lungs every 6 (six) hours as needed. For wheezing      . colchicine 0.6 MG tablet Take 1 tablet (0.6 mg total) by mouth daily.  20 tablet  0  . indomethacin (INDOCIN) 25 MG capsule Take 1 capsule (25 mg total) by mouth 3 (three) times daily as needed.  30 capsule  0  . omeprazole (PRILOSEC) 40 MG capsule Take 1 capsule (40 mg total) by mouth daily. You can also buy omeprazole over-the-counter at target, [employer] or [employer] if that is cheaper. Just be sure to take 40 mg per day  30 capsule  3   Family History  Problem Relation Age of Onset  . Diabetes Mother   . Diabetes Maternal Grandmother   . Allergies Daughter     mold allergy, treatment x 1 year   History    Social History  . Marital Status: Single    Spouse Name: N/A    Number of Children: 5  . Years of Education: 11th grade   Occupational History  . 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 Smoking status: Current Everyday Smoker -- 1.0 packs/day for 38 years    Types: Cigarettes  . Smokeless tobacco: None   Comment: thinking about.  Wants something to stop.  Marland Kitchen Alcohol Use: 0.0 oz/week    .1 drink(s) per week  . Drug Use: 1 per week    Special: Marijuana  . Sexually Active: None   Other Topics Concern  . None   Social History Narrative   Has  5 kids. Cleans pools. Daughter has chronic urticaria (?allergic to mold).    Review of Systems: General: no fevers, chills, changes in weight, changes in appetite Skin: no rash HEENT: no blurry vision, hearing changes, sore throat Pulm: no dyspnea, coughing, wheezing CV: no chest pain, palpitations, shortness of breath Abd: no abdominal pain, nausea/vomiting, diarrhea/constipation GU: no dysuria, hematuria, polyuria Ext: no arthralgias, myalgias Neuro: no weakness, numbness, or tingling   Objective:  Physical Exam: Filed Vitals:   01/12/12 1458  BP: 114/73  Pulse: 81  Temp: 97.9 F (36.6 C)  TempSrc: Oral  Height: 5' 11.5" (1.816 m)  Weight: 216 lb 6.4 oz (98.158 kg)  SpO2: 99%   Constitutional: Vital signs reviewed.  Patient is a well-developed and well-nourished man in no acute distress and cooperative with exam. Cardiovascular: RRR, S1 normal, S2 normal, no MRG, pulses symmetric and intact bilaterally Pulmonary/Chest: CTAB, no wheezes, rales, or rhonchi Abdominal: Soft. Non-tender, non-distended, bowel sounds are normal, no masses, organomegaly, or guarding present.  Musculoskeletal: No joint deformities, erythema, or stiffness, ROM full and nontender Neurological: A&O x3, Strength is normal and symmetric bilaterally, cranial nerve II-XII are grossly intact,  no focal motor deficit, sensory intact to light touch bilaterally.  Skin: Warm, dry and intact. No rash, cyanosis, or clubbing.  Psychiatric: Normal mood and affect. speech and behavior is normal. Judgment and thought content normal. Cognition and memory are normal.   Assessment & Plan:  Case and care discussed with Dr. Rogelia Boga. Please see problem-oriented turning for further details. Patient to return in one year for annual followup, unless he needs [place] sooner.

## 2012-01-12 NOTE — Patient Instructions (Addendum)
-  I am not concerning about a foot or ankle infection at this time.  -Unfortunately, we don't have any rheumatologists available this month, but be sure to call the clinic to follow up with [place] again in 3-6 months.  Please be sure to bring all of your medications with you to every visit.  Should you have any new or worsening symptoms, please be sure to call the clinic at [phone].

## 2012-12-03 ENCOUNTER — Encounter (HOSPITAL_COMMUNITY): Payer: Self-pay | Admitting: *Deleted

## 2012-12-03 ENCOUNTER — Emergency Department (HOSPITAL_COMMUNITY)
Admission: EM | Admit: 2012-12-03 | Discharge: 2012-12-04 | Disposition: A | Discharge status: Home / Self Care | Payer: Self-pay | Attending: Emergency Medicine | Admitting: Emergency Medicine

## 2012-12-03 DIAGNOSIS — Z880 Allergy status to penicillin: Secondary | ICD-10-CM | POA: Insufficient documentation

## 2012-12-03 DIAGNOSIS — J45909 Unspecified asthma, uncomplicated: Secondary | ICD-10-CM | POA: Insufficient documentation

## 2012-12-03 DIAGNOSIS — L03119 Cellulitis of unspecified part of limb: Secondary | ICD-10-CM

## 2012-12-03 DIAGNOSIS — F17200 Nicotine dependence, unspecified, uncomplicated: Secondary | ICD-10-CM | POA: Insufficient documentation

## 2012-12-03 DIAGNOSIS — L02419 Cutaneous abscess of limb, unspecified: Secondary | ICD-10-CM | POA: Insufficient documentation

## 2012-12-03 DIAGNOSIS — R0982 Postnasal drip: Secondary | ICD-10-CM | POA: Insufficient documentation

## 2012-12-03 DIAGNOSIS — J029 Acute pharyngitis, unspecified: Secondary | ICD-10-CM | POA: Insufficient documentation

## 2012-12-03 DIAGNOSIS — Z9889 Other specified postprocedural states: Secondary | ICD-10-CM | POA: Insufficient documentation

## 2012-12-03 DIAGNOSIS — J3489 Other specified disorders of nose and nasal sinuses: Secondary | ICD-10-CM | POA: Insufficient documentation

## 2012-12-03 DIAGNOSIS — Z79899 Other long term (current) drug therapy: Secondary | ICD-10-CM | POA: Insufficient documentation

## 2012-12-03 NOTE — ED Provider Notes (Signed)
History    CSN: [id number] Arrival date & time 12/03/12  2110  First MD Initiated Contact with Patient 12/03/12 2338     Chief Complaint  Patient presents with  . Mass  . Insect Bite   HPI  History provided by the patient. Patient is a 42 year old male with no significant PMH who presents with concerns for insect bite to the medial right ankle. Patient first noticed what he thought was a small ingrown hair yesterday which she attempted to pop. Reports having small amount of clear fluid from the area. Today however there is increased redness and pain around the area. He states occasionally this has a small amount of bleeding or drainage. Denies any erythematous streaks up the leg. 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 Denies any known tick bites. Patient also has second complaint of sore throat over the past week. He has had significant left-sided maxillary sinus pressure and congestion. He also reports postnasal drip. He feels tenderness in the upper glands in his neck and tonsil area. He has not used any medications or treatment for his symptoms. Denies any associated fever, chills or sweats. No cough. No dental pains or problems. No difficulty breathing or swallowing.    Past Medical History  Diagnosis Date  . Asthma    Past Surgical History  Procedure Laterality Date  . I&d extremity  10/13/2011    Procedure: IRRIGATION AND DEBRIDEMENT EXTREMITY;  Surgeon: Tami Ribas, MD;  Location: [facility] OR;  Service: Orthopedics;  Laterality: Left;   Family History  Problem Relation Age of Onset  . Diabetes Mother   . Diabetes Maternal Grandmother   . Allergies Daughter     mold allergy, treatment x 1 year   History  Substance Use Topics  . Smoking status: Current Every Day Smoker -- 1.00 packs/day for 38 years    Types: Cigarettes  . Smokeless tobacco: Not on file     Comment: thinking about.  Wants something to stop.  Marland Kitchen  Alcohol Use: 0.0 oz/week    .1 drink(s) per week    Review of Systems  Constitutional: Negative for fever and chills.  HENT: Positive for congestion, sore throat and sinus pressure.   Respiratory: Negative for cough and shortness of breath.   Gastrointestinal: Negative for nausea, vomiting and abdominal pain.  Skin: Negative for rash.  All other systems reviewed and are negative.    Allergies  Cephalosporins; Amoxicillin; Augmentin; Bactrim; Clindamycin/lincomycin; Penicillins; and Sulfa antibiotics  Home Medications   Current Outpatient Rx  Name  Route  Sig  Dispense  Refill  . albuterol (PROVENTIL HFA;VENTOLIN HFA) 108 (90 BASE) MCG/ACT inhaler   Inhalation   Inhale 2 puffs into the lungs every 6 (six) hours as needed. For wheezing         . omeprazole (PRILOSEC) 40 MG capsule   Oral   Take 40 mg by mouth daily as needed. For heartburn          BP 128/74  Pulse 110  Temp(Src) 99 F (37.2 C) (Oral)  Resp 16  SpO2 96% Physical Exam  Nursing note and vitals reviewed. Constitutional: He is oriented to person, place, and time. He appears well-developed and well-nourished. No distress.  HENT:  Head: Normocephalic.  Mild erythema to the pharynx with cobblestoning. Tonsils appear normal in color and size. No exudate. Uvula midline.  Neck: Normal range of motion. Neck supple.  No masses or lymphadenopathy.  Cardiovascular:  Normal rate and regular rhythm.   Pulmonary/Chest: Effort normal and breath sounds normal. No respiratory distress. He has no wheezes. He has no rales.  Abdominal: Soft.  Musculoskeletal: Normal range of motion.  Lymphadenopathy:    He has no cervical adenopathy.  Neurological: He is alert and oriented to person, place, and time.  Skin: Skin is warm.  Small 8 mm pustule to the medial right lower ankle with surrounding erythema and tenderness. No erythematous streaks.  Psychiatric: He has a normal mood and affect.    ED Course  Procedures        1. Cellulitis of ankle   2. Pharyngitis       MDM  11:45PM patient seen and evaluated. Patient appears well no acute distress. Patient does not appear severely ill or toxic.  Angus Seller, PA-C 12/05/12 [phone]

## 2012-12-03 NOTE — ED Notes (Signed)
Rt. Medial ankle: 1 cm swollen, reddened area. Skin intact. Also, c/o swollen glands in neck. Hard to swallow at times.

## 2012-12-04 MED ORDER — DOXYCYCLINE HYCLATE 100 MG PO CAPS: 100.0000 mg | ORAL_CAPSULE | Freq: Two times a day (BID) | ORAL | Status: DC

## 2012-12-04 MED ORDER — GUAIFENESIN ER 600 MG PO TB12: 1200.0000 mg | ORAL_TABLET | Freq: Two times a day (BID) | ORAL | Status: DC

## 2012-12-04 MED ORDER — CETIRIZINE-PSEUDOEPHEDRINE ER 5-120 MG PO TB12: 1.0000 | ORAL_TABLET | Freq: Two times a day (BID) | ORAL | Status: DC

## 2012-12-05 NOTE — ED Provider Notes (Signed)
Medical screening examination/treatment/procedure(s) were performed by non-physician practitioner and as supervising physician I was immediately available for consultation/collaboration.   Laray Anger, DO 12/05/12 2042

## 2013-04-04 ENCOUNTER — Emergency Department (HOSPITAL_COMMUNITY)
Admission: EM | Admit: 2013-04-04 | Discharge: 2013-04-05 | Disposition: A | Discharge status: Home / Self Care | Payer: Self-pay | Attending: Emergency Medicine | Admitting: Emergency Medicine

## 2013-04-04 ENCOUNTER — Encounter (HOSPITAL_COMMUNITY): Payer: Self-pay | Admitting: Emergency Medicine

## 2013-04-04 DIAGNOSIS — J45909 Unspecified asthma, uncomplicated: Secondary | ICD-10-CM | POA: Insufficient documentation

## 2013-04-04 DIAGNOSIS — H60399 Other infective otitis externa, unspecified ear: Secondary | ICD-10-CM | POA: Insufficient documentation

## 2013-04-04 DIAGNOSIS — Z880 Allergy status to penicillin: Secondary | ICD-10-CM | POA: Insufficient documentation

## 2013-04-04 DIAGNOSIS — Z79899 Other long term (current) drug therapy: Secondary | ICD-10-CM | POA: Insufficient documentation

## 2013-04-04 DIAGNOSIS — H6091 Unspecified otitis externa, right ear: Secondary | ICD-10-CM

## 2013-04-04 DIAGNOSIS — F17200 Nicotine dependence, unspecified, uncomplicated: Secondary | ICD-10-CM | POA: Insufficient documentation

## 2013-04-04 NOTE — ED Notes (Signed)
Pt c/o discomfort in R ear x 1 week, pt states worse after shower.

## 2013-04-05 MED ORDER — NEOMYCIN-POLYMYXIN-HC 3.5-10000-1 OT SUSP: 4.0000 [drp] | Freq: Four times a day (QID) | OTIC | Status: DC

## 2013-04-05 MED ORDER — NEOMYCIN-COLIST-HC-THONZONIUM 3.3-3-10-0.5 MG/ML OT SUSP
1.0000 [drp] | Freq: Once | OTIC | Status: AC
  Administered 2013-04-05: 1 [drp] via OTIC
  Filled 2013-04-05: qty 5

## 2013-04-05 NOTE — ED Notes (Signed)
PA at bedside.

## 2013-04-05 NOTE — ED Provider Notes (Signed)
CSN: [id number]     Arrival date & time 04/04/13  2348 History   First MD Initiated Contact with Patient 04/04/13 2357     Chief Complaint  Patient presents with  . Ear Fullness   (Consider location/radiation/quality/duration/timing/severity/associated sxs/prior Treatment) Patient is a 42 y.o. male presenting with plugged ear sensation. The history is provided by the patient.  Ear Fullness This is a new problem. The current episode started more than 2 days ago. The problem occurs constantly. The problem has been gradually worsening. Pertinent negatives include no chest pain, no abdominal pain and no shortness of breath. Nothing aggravates the symptoms. Nothing relieves the symptoms. He has tried nothing for the symptoms.    Past Medical History  Diagnosis Date  . Asthma    Past Surgical History  Procedure Laterality Date  . I&d extremity  10/13/2011    Procedure: IRRIGATION AND DEBRIDEMENT EXTREMITY;  Surgeon: Tami Ribas, MD;  Location: [facility] OR;  Service: Orthopedics;  Laterality: Left;   Family History  Problem Relation Age of Onset  . Diabetes Mother   . Diabetes Maternal Grandmother   . Allergies Daughter     mold allergy, treatment x 1 year   History  Substance Use Topics  . Smoking status: Current Every Day Smoker -- 1.00 packs/day for 38 years    Types: Cigarettes  . Smokeless tobacco: Not on file     Comment: thinking about.  Wants something to stop.  Marland Kitchen Alcohol Use: 0.0 oz/week    .1 drink(s) per week    Review of Systems  Constitutional: Negative for fever.  Respiratory: Negative for cough and shortness of breath.   Cardiovascular: Negative for chest pain.  Gastrointestinal: Negative for abdominal pain.  All other systems reviewed and are negative.    Allergies  Cephalosporins; Amoxicillin; Augmentin; Bactrim; Clindamycin/lincomycin; Penicillins; and Sulfa antibiotics  Home Medications   Current Outpatient Rx  Name  Route  Sig  Dispense  Refill  .  albuterol (PROVENTIL HFA;VENTOLIN HFA) 108 (90 BASE) MCG/ACT inhaler   Inhalation   Inhale 2 puffs into the lungs every 6 (six) hours as needed for wheezing. For wheezing         . ibuprofen (ADVIL,MOTRIN) 200 MG tablet   Oral   Take 400 mg by mouth every 6 (six) hours as needed for pain.         Marland Kitchen omeprazole (PRILOSEC) 40 MG capsule   Oral   Take 40 mg by mouth daily as needed. For heartburn         . neomycin-polymyxin-hydrocortisone (CORTISPORIN) 3.5-10000-1 otic suspension   Right Ear   Place 4 drops into the right ear 4 (four) times daily. X 7 days   10 mL   0    BP 129/76  Pulse 93  Temp(Src) 98.7 F (37.1 C) (Oral)  Resp 18  Ht 6' (1.829 m)  Wt 210 lb (95.255 kg)  BMI 28.47 kg/m2  SpO2 98% Physical Exam  Constitutional: He is oriented to person, place, and time. He appears well-developed and well-nourished. No distress.  HENT:  Head: Normocephalic and atraumatic.  Right Ear: There is swelling and tenderness. There is mastoid tenderness (mild).  Left Ear: Tympanic membrane normal.  Mouth/Throat: No oropharyngeal exudate.  Eyes: EOM are normal. Pupils are equal, round, and reactive to light.  Neck: Normal range of motion. Neck supple.  Cardiovascular: Normal rate and regular rhythm.  Exam reveals no friction rub.   No murmur heard. Pulmonary/Chest: Effort normal and  breath sounds normal. No respiratory distress. He has no wheezes. He has no rales.  Abdominal: He exhibits no distension. There is no tenderness. There is no rebound.  Musculoskeletal: Normal range of motion. He exhibits no edema.  Neurological: He is alert and oriented to person, place, and time.  Skin: He is not diaphoretic.    ED Course  Procedures (including critical care time) Labs Review Labs Reviewed - No data to display Imaging Review No results found.  EKG Interpretation   None       MDM   1. Otitis externa, right    89M with R ear fullness for several weeks, worse today.  Denies fevers, difficulty with vision. On exam, R ear canal swollen with purulence. Will irrigate and place on Cortisporin drops with wick in place. No posterior ear fullness, redness, fluctuance. Very mild tenderness at posterior ear, no mastoid erythema or fullness. Patient does not need CT at this time. Earwick placed, cortisporin drops administered.   Dagmar Hait, MD 04/05/13 [phone]

## 2015-07-08 ENCOUNTER — Ambulatory Visit: Payer: BLUE CROSS/BLUE SHIELD | Admitting: Pediatrics

## 2015-08-16 ENCOUNTER — Ambulatory Visit: Payer: BLUE CROSS/BLUE SHIELD | Admitting: Pediatrics

## 2015-11-17 ENCOUNTER — Other Ambulatory Visit: Payer: Self-pay | Admitting: Physician Assistant

## 2015-11-17 DIAGNOSIS — M25512 Pain in left shoulder: Secondary | ICD-10-CM

## 2015-11-30 ENCOUNTER — Ambulatory Visit
Admission: RE | Admit: 2015-11-30 | Discharge: 2015-11-30 | Disposition: A | Discharge status: Home / Self Care | Payer: BLUE CROSS/BLUE SHIELD | Source: Ambulatory Visit | Attending: Physician Assistant | Admitting: Physician Assistant

## 2015-11-30 DIAGNOSIS — M25512 Pain in left shoulder: Secondary | ICD-10-CM

## 2017-05-24 ENCOUNTER — Emergency Department (HOSPITAL_BASED_OUTPATIENT_CLINIC_OR_DEPARTMENT_OTHER)
Admit: 2017-05-24 | Discharge: 2017-05-24 | Disposition: A | Discharge status: Home / Self Care | Payer: BLUE CROSS/BLUE SHIELD | Attending: Emergency Medicine | Admitting: Emergency Medicine

## 2017-05-24 ENCOUNTER — Other Ambulatory Visit: Payer: Self-pay

## 2017-05-24 ENCOUNTER — Encounter (HOSPITAL_COMMUNITY): Payer: Self-pay | Admitting: Emergency Medicine

## 2017-05-24 ENCOUNTER — Emergency Department (HOSPITAL_COMMUNITY)
Admission: EM | Admit: 2017-05-24 | Discharge: 2017-05-24 | Disposition: A | Discharge status: Home / Self Care | Payer: BLUE CROSS/BLUE SHIELD | Attending: Emergency Medicine | Admitting: Emergency Medicine

## 2017-05-24 ENCOUNTER — Emergency Department (HOSPITAL_COMMUNITY): Payer: BLUE CROSS/BLUE SHIELD

## 2017-05-24 DIAGNOSIS — J45909 Unspecified asthma, uncomplicated: Secondary | ICD-10-CM | POA: Diagnosis not present

## 2017-05-24 DIAGNOSIS — Z79899 Other long term (current) drug therapy: Secondary | ICD-10-CM | POA: Diagnosis not present

## 2017-05-24 DIAGNOSIS — F17210 Nicotine dependence, cigarettes, uncomplicated: Secondary | ICD-10-CM | POA: Insufficient documentation

## 2017-05-24 DIAGNOSIS — M79604 Pain in right leg: Secondary | ICD-10-CM

## 2017-05-24 DIAGNOSIS — M7989 Other specified soft tissue disorders: Secondary | ICD-10-CM

## 2017-05-24 DIAGNOSIS — M79609 Pain in unspecified limb: Secondary | ICD-10-CM | POA: Diagnosis not present

## 2017-05-24 LAB — D-DIMER, QUANTITATIVE: D-Dimer, Quant: 0.8 ug/mL-FEU — ABNORMAL HIGH (ref 0.00–0.50)

## 2017-05-24 LAB — I-STAT CHEM 8, ED
BUN: 14 mg/dL (ref 6–20)
CREATININE: 0.6 mg/dL — AB (ref 0.61–1.24)
Calcium, Ion: 1.23 mmol/L (ref 1.15–1.40)
Chloride: 102 mmol/L (ref 101–111)
Glucose, Bld: 125 mg/dL — ABNORMAL HIGH (ref 65–99)
HEMATOCRIT: 44 % (ref 39.0–52.0)
Hemoglobin: 15 g/dL (ref 13.0–17.0)
POTASSIUM: 3.8 mmol/L (ref 3.5–5.1)
SODIUM: 141 mmol/L (ref 135–145)
TCO2: 25 mmol/L (ref 22–32)

## 2017-05-24 MED ORDER — KETOROLAC TROMETHAMINE 60 MG/2ML IM SOLN
60.0000 mg | Freq: Once | INTRAMUSCULAR | Status: AC
  Administered 2017-05-24: 60 mg via INTRAMUSCULAR
  Filled 2017-05-24: qty 2

## 2017-05-24 MED ORDER — DIAZEPAM 5 MG PO TABS: 5.0000 mg | ORAL_TABLET | Freq: Three times a day (TID) | ORAL | 0 refills | Status: DC | PRN

## 2017-05-24 MED ORDER — HYDROCODONE-ACETAMINOPHEN 5-325 MG PO TABS
1.0000 | ORAL_TABLET | Freq: Once | ORAL | Status: AC
  Administered 2017-05-24: 1 via ORAL
  Filled 2017-05-24: qty 1

## 2017-05-24 NOTE — ED Provider Notes (Signed)
[facility] Provider Note   CSN: [id number] Arrival date & time: 05/24/17  [id number]     History   Chief Complaint Chief Complaint  Patient presents with  . Leg Pain    HPI Bruce Vaughn is a 46 y.o. male.  The history is provided by the patient.  Leg Pain   This is a new problem. The current episode started 6 to 12 hours ago. The problem occurs constantly. The problem has been gradually worsening. The pain is present in the right upper leg and right lower leg. The pain is moderate. Associated symptoms include limited range of motion. Treatments tried: muscle relaxant. The treatment provided no relief.   Patient reports he was watching TV when he began having right upper and right lower leg pain spontaneously Denies any recent trauma or exertion No fevers or vomiting, no chest pain or shortness of breath. Denies any history of DVT He took a muscle relaxant without any improvement Past Medical History:  Diagnosis Date  . Asthma     Patient Active Problem List   Diagnosis Date Noted  . Joint pain 01/12/2012  . GERD (gastroesophageal reflux disease) 11/03/2011  . Purpura (HCC) 11/01/2011  . Asthma 11/01/2011    Past Surgical History:  Procedure Laterality Date  . I&D EXTREMITY  10/13/2011   Procedure: IRRIGATION AND DEBRIDEMENT EXTREMITY;  Surgeon: Tami RibasKevin R Kuzma, MD;  Location: [facility] OR;  Service: Orthopedics;  Laterality: Left;       Home Medications    Prior to Admission medications   Medication Sig Start Date End Date Taking? Authorizing Provider  albuterol (PROVENTIL HFA;VENTOLIN HFA) 108 (90 BASE) MCG/ACT inhaler Inhale 2 puffs into the lungs every 6 (six) hours as needed for wheezing. For wheezing    [provider]  ibuprofen (ADVIL,MOTRIN) 200 MG tablet Take 400 mg by mouth every 6 (six) hours as needed for pain.    [provider]  neomycin-polymyxin-hydrocortisone (CORTISPORIN) 3.5-10000-1 otic  suspension Place 4 drops into the right ear 4 (four) times daily. X 7 days 04/05/13   Elwin MochaWalden, Blair, MD  omeprazole (PRILOSEC) 40 MG capsule Take 40 mg by mouth daily as needed. For heartburn 11/20/11 12/03/13  Raisch, Kayleen MemosMichael C, MD    Family History Family History  Problem Relation Age of Onset  . Diabetes Mother   . Diabetes Maternal Grandmother   . Allergies Daughter        mold allergy, treatment x 1 year    Social History Social History   Tobacco Use  . Smoking status: Current Every Day Smoker    Packs/day: 1.00    Years: 38.00    Pack years: 38.00    Types: Cigarettes  . Smokeless tobacco: Never Used  . Tobacco comment: thinking about.  Wants something to stop.  Substance Use Topics  . Alcohol use: Yes    Alcohol/week: 0.0 oz  . Drug use: Yes    Frequency: 1.0 times per week    Types: Marijuana     Allergies   Cephalosporins; Amoxicillin; Augmentin [amoxicillin-pot clavulanate]; Bactrim [sulfamethoxazole-trimethoprim]; Clindamycin/lincomycin; Penicillins; and Sulfa antibiotics   Review of Systems Review of Systems  Constitutional: Negative for fever.  Respiratory: Negative for shortness of breath.   Cardiovascular: Negative for chest pain.  Gastrointestinal: Negative for vomiting.  Musculoskeletal: Positive for arthralgias.  All other systems reviewed and are negative.    Physical Exam Updated Vital Signs BP 111/69 (BP Location: Right Arm)   Pulse 77  Temp 97.6 F (36.4 C) (Oral)   Resp 18   Ht 1.829 m (6')   Wt 95.3 kg (210 lb)   SpO2 100%   BMI 28.48 kg/m   Physical Exam CONSTITUTIONAL: Well developed/well nourished HEAD: Normocephalic/atraumatic EYES: EOMI ENMT: Mucous membranes moist NECK: supple no meningeal signs SPINE/BACK:entire spine nontender CV: S1/S2 noted, no murmurs/rubs/gallops noted LUNGS: Lungs are clear to auscultation bilaterally, no apparent distress ABDOMEN: soft, nontender NEURO: Pt is awake/alert/appropriate, moves all  extremitiesx4.  No facial droop.   EXTREMITIES: pulses normal/equal, full ROM, diffuse tenderness to right thigh, right knee, right calf There is no erythema, no skin changes, no crepitus, no joint effusion noted His legs are symmetric Distal pulses are equal and intact, and his feet are warm to touch No hip tenderness is noted, full range of motion of right lower extremity No LE edema is noted SKIN: warm, color normal PSYCH: no abnormalities of mood noted, alert and oriented to situation   ED Treatments / Results  Labs (all labs ordered are listed, but only abnormal results are displayed) Labs Reviewed  D-DIMER, QUANTITATIVE (NOT AT [facility]) - Abnormal; Notable for the following components:      Result Value   D-Dimer, Quant 0.80 (*)    All other components within normal limits  I-STAT CHEM 8, ED - Abnormal; Notable for the following components:   Creatinine, Ser 0.60 (*)    Glucose, Bld 125 (*)    All other components within normal limits    EKG  EKG Interpretation None       Radiology Dg Knee Complete 4 Views Right  Result Date: 05/24/2017 CLINICAL DATA:  Right lower extremity pain from the hip to the knee. EXAM: RIGHT KNEE - COMPLETE 4+ VIEW COMPARISON:  None. FINDINGS: No evidence of fracture, dislocation, or joint effusion. No evidence of arthropathy or other focal bone abnormality. Soft tissues are unremarkable. IMPRESSION: Negative radiographs of the right knee. Electronically Signed   By: Rubye Oaks M.D.   On: 05/24/2017 05:45   Dg Femur Min 2 Views Right  Result Date: 05/24/2017 CLINICAL DATA:  Right lower extremity pain from the hip to the knee. No known injury. EXAM: RIGHT FEMUR 2 VIEWS COMPARISON:  None. FINDINGS: Cortical margins of the right femur are intact. There is no evidence of fracture or other focal bone lesions. No periosteal reaction or bony destructive change. Soft tissues are unremarkable. IMPRESSION: Negative radiographs of the right femur.  Electronically Signed   By: Rubye Oaks M.D.   On: 05/24/2017 05:45    Procedures Procedures    Medications Ordered in ED Medications  HYDROcodone-acetaminophen (NORCO/VICODIN) 5-325 MG per tablet 1 tablet (not administered)  HYDROcodone-acetaminophen (NORCO/VICODIN) 5-325 MG per tablet 1 tablet (1 tablet Oral Given 05/24/17 5409)     Initial Impression / Assessment and Plan / ED Course  I have reviewed the triage vital signs and the nursing notes.  Pertinent labs & imaging results that were available during my care of the patient were reviewed by me and considered in my medical decision making (see chart for details). Narcotic database reviewed and considered in decision making     Unclear cause of isolated atraumatic right leg pain Other than tenderness, there are no significant findings He is low risk for DVT, will send d-dimer and also check electrolytes 7:18 AM D-dimer elevated Will need DVT study If negative he can be discharged with followup Signed out to Dr Anitra Lauth at shift change Final Clinical Impressions(s) / ED  Diagnoses   Final diagnoses:  None    ED Discharge Orders    None       Zadie RhineWickline, Tika Hannis, MD 05/24/17 [phone]

## 2017-05-24 NOTE — ED Notes (Signed)
Vascular Tech paged.  

## 2017-05-24 NOTE — Discharge Instructions (Signed)
You can take 2 extra strength Tylenol every 4 hours as needed today for pain.  Starting tomorrow you can take ibuprofen 3 tablets and Tylenol together every 6 hours.  You can also try over-the-counter lidocaine patches.  If you start developing redness or swelling of the joint you should be seen immediately either in the emergency room or an orthopedist office.

## 2017-05-24 NOTE — ED Triage Notes (Signed)
Pt c/o 9/10 right leg pain since yesterday at 5 pm getting worse today, pt denies any fall or injury.

## 2017-05-24 NOTE — ED Notes (Signed)
Transported to vascular. 

## 2017-05-24 NOTE — ED Provider Notes (Signed)
Assumed care from Dr. Bebe ShaggyWickline at 7 AM.  Patient's DVT test is negative.  On reevaluation patient has a 2+ DP pulse.  There is no crepitus, erythema or swelling of the right lower extremity.  Pain seems to be more localized around the knee with some mild popping with flexion and extension.  Low suspicion for septic joint or necrotizing fasciitis at this time.  Labs are reassuring.  Discussed strict return precautions and given orthopedic follow-up.   Gwyneth SproutPlunkett, Giovanie Lefebre, MD 05/24/17 [phone]

## 2017-05-24 NOTE — Progress Notes (Signed)
RLE venous duplex prelim: negative for DVT.  Nikolus Marczak Eunice, RDMS, RVT  

## 2019-06-01 IMAGING — CR DG KNEE COMPLETE 4+V*R*
4 series · 4 of 4 positions shown · non-contrast
Comparison: None.

CLINICAL DATA: Right lower extremity pain from the hip to the knee.

EXAM:
RIGHT KNEE - COMPLETE 4+ VIEW

[knee ap]
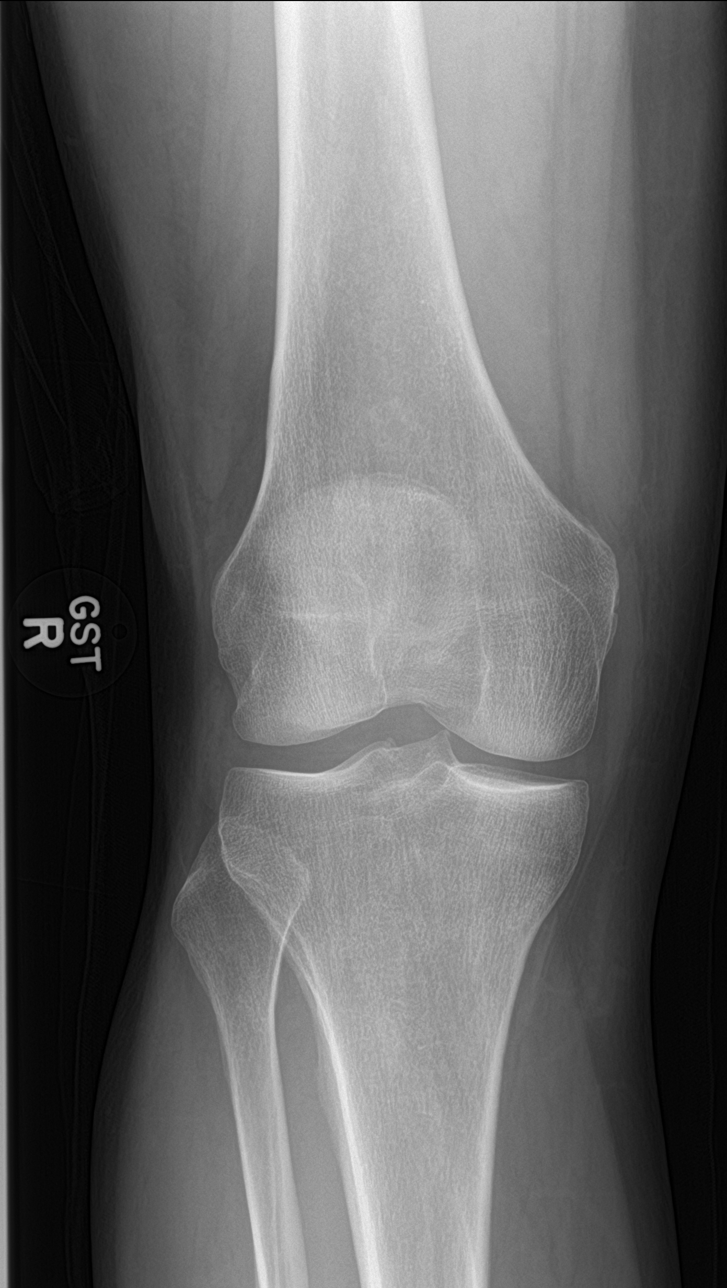

[knee lat]
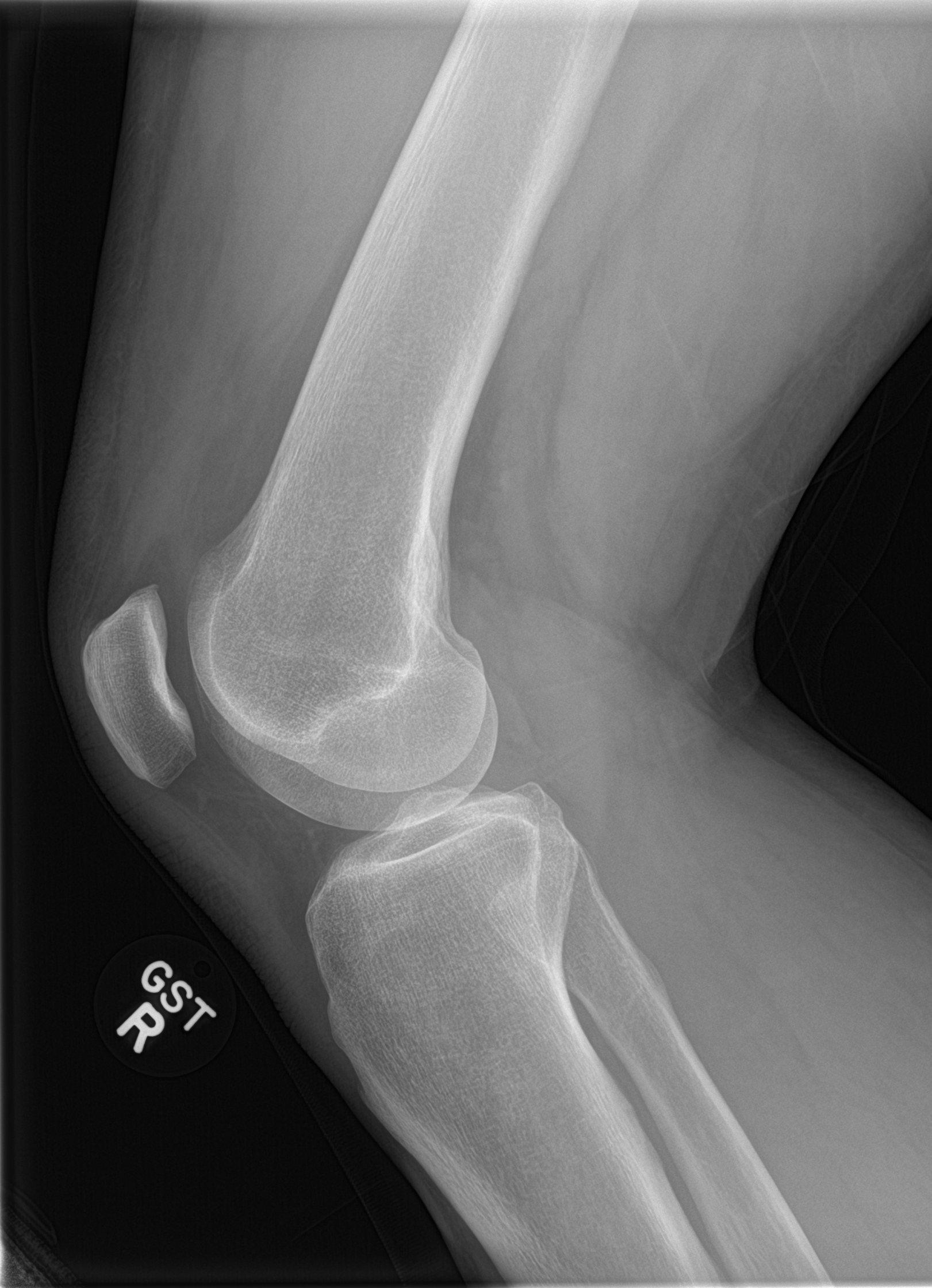

[knee obl (1 of 2)]
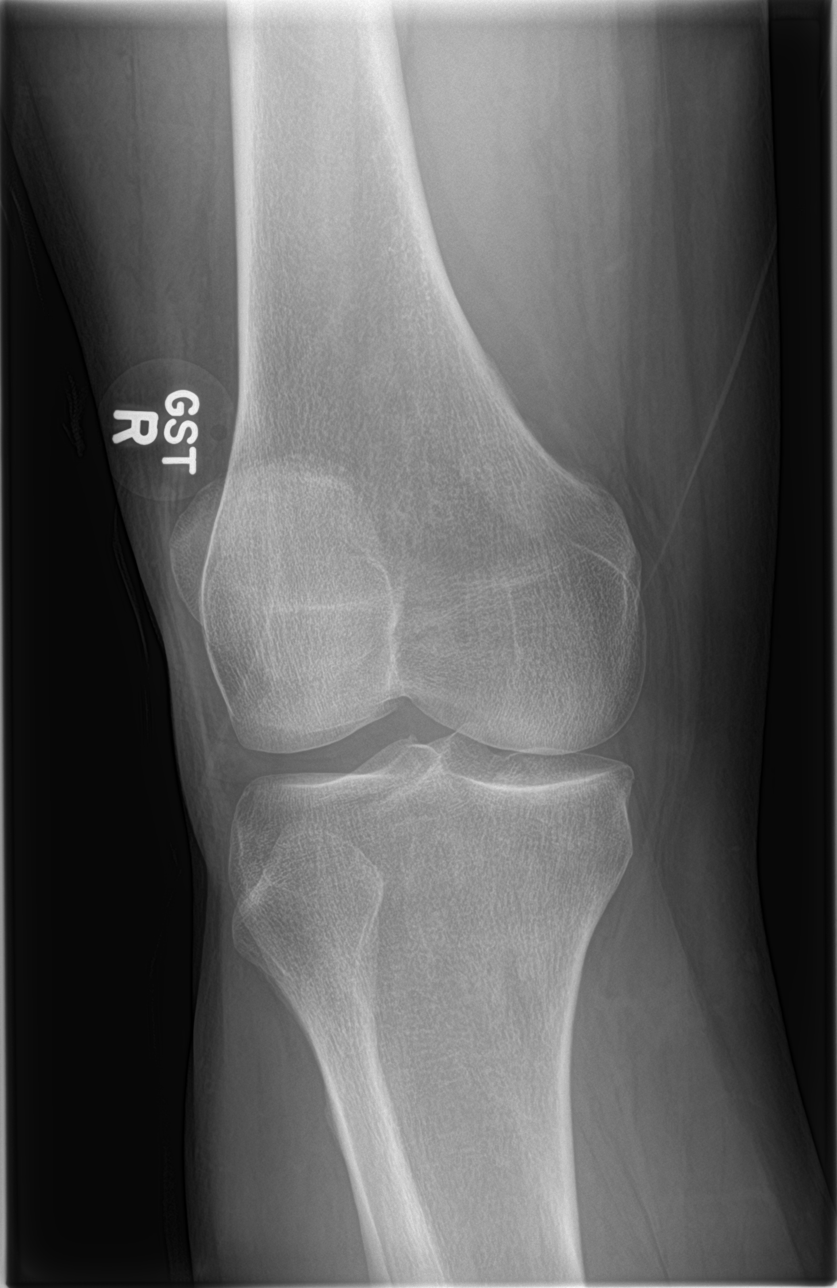

[knee obl (2 of 2)]
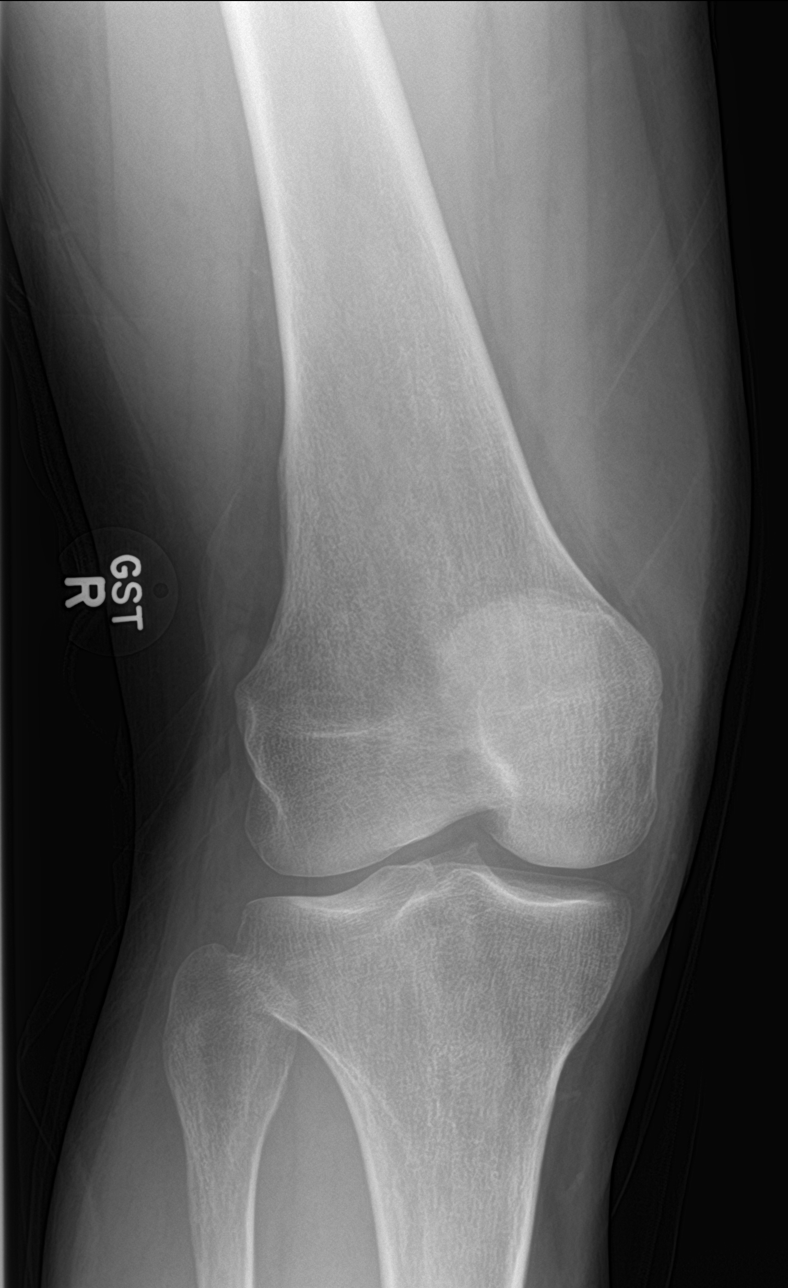

[4 of 4 positions shown; findings below may reference images not displayed]

FINDINGS: No evidence of fracture, dislocation, or joint effusion. No evidence
of arthropathy or other focal bone abnormality. Soft tissues are
unremarkable.
IMPRESSION: Negative radiographs of the right knee.

## 2019-10-30 ENCOUNTER — Other Ambulatory Visit: Payer: Self-pay | Admitting: Orthopedic Surgery

## 2019-11-20 ENCOUNTER — Encounter (HOSPITAL_BASED_OUTPATIENT_CLINIC_OR_DEPARTMENT_OTHER): Payer: Self-pay | Admitting: Orthopedic Surgery

## 2019-11-20 ENCOUNTER — Other Ambulatory Visit: Payer: Self-pay

## 2019-11-21 NOTE — Progress Notes (Signed)

## 2019-11-25 ENCOUNTER — Other Ambulatory Visit (HOSPITAL_COMMUNITY)
Admission: RE | Admit: 2019-11-25 | Discharge: 2019-11-25 | Disposition: A | Discharge status: Home / Self Care | Payer: BC Managed Care – PPO | Source: Ambulatory Visit | Attending: Orthopedic Surgery | Admitting: Orthopedic Surgery

## 2019-11-25 DIAGNOSIS — Z01812 Encounter for preprocedural laboratory examination: Secondary | ICD-10-CM | POA: Diagnosis not present

## 2019-11-25 DIAGNOSIS — Z20822 Contact with and (suspected) exposure to covid-19: Secondary | ICD-10-CM | POA: Insufficient documentation

## 2019-11-25 LAB — SARS CORONAVIRUS 2 (TAT 6-24 HRS): SARS Coronavirus 2: NEGATIVE

## 2019-11-28 ENCOUNTER — Ambulatory Visit (HOSPITAL_BASED_OUTPATIENT_CLINIC_OR_DEPARTMENT_OTHER): Payer: No Typology Code available for payment source | Admitting: Anesthesiology

## 2019-11-28 ENCOUNTER — Ambulatory Visit (HOSPITAL_BASED_OUTPATIENT_CLINIC_OR_DEPARTMENT_OTHER)
Admission: RE | Admit: 2019-11-28 | Discharge: 2019-11-28 | Disposition: A | Discharge status: Home / Self Care | Payer: No Typology Code available for payment source | Attending: Orthopedic Surgery | Admitting: Orthopedic Surgery

## 2019-11-28 ENCOUNTER — Encounter (HOSPITAL_BASED_OUTPATIENT_CLINIC_OR_DEPARTMENT_OTHER)
Admission: RE | Disposition: A | Discharge status: Home / Self Care | Payer: Self-pay | Source: Home / Self Care | Attending: Orthopedic Surgery

## 2019-11-28 ENCOUNTER — Other Ambulatory Visit: Payer: Self-pay

## 2019-11-28 ENCOUNTER — Encounter (HOSPITAL_BASED_OUTPATIENT_CLINIC_OR_DEPARTMENT_OTHER): Payer: Self-pay | Admitting: Orthopedic Surgery

## 2019-11-28 DIAGNOSIS — G5602 Carpal tunnel syndrome, left upper limb: Secondary | ICD-10-CM | POA: Insufficient documentation

## 2019-11-28 DIAGNOSIS — Z882 Allergy status to sulfonamides status: Secondary | ICD-10-CM | POA: Diagnosis not present

## 2019-11-28 DIAGNOSIS — Z881 Allergy status to other antibiotic agents status: Secondary | ICD-10-CM | POA: Insufficient documentation

## 2019-11-28 DIAGNOSIS — J45909 Unspecified asthma, uncomplicated: Secondary | ICD-10-CM | POA: Insufficient documentation

## 2019-11-28 DIAGNOSIS — Z880 Allergy status to penicillin: Secondary | ICD-10-CM | POA: Insufficient documentation

## 2019-11-28 DIAGNOSIS — Z87891 Personal history of nicotine dependence: Secondary | ICD-10-CM | POA: Diagnosis not present

## 2019-11-28 HISTORY — PX: CARPAL TUNNEL RELEASE: SHX101

## 2019-11-28 HISTORY — DX: Depression, unspecified: F32.A

## 2019-11-28 SURGERY — CARPAL TUNNEL RELEASE
Anesthesia: General | Site: Wrist | Laterality: Left

## 2019-11-28 MED ORDER — FENTANYL CITRATE (PF) 100 MCG/2ML IJ SOLN
INTRAMUSCULAR | Status: AC
  Filled 2019-11-28: qty 2

## 2019-11-28 MED ORDER — LIDOCAINE 2% (20 MG/ML) 5 ML SYRINGE
INTRAMUSCULAR | Status: DC | PRN
  Administered 2019-11-28: 60 mg via INTRAVENOUS

## 2019-11-28 MED ORDER — ACETAMINOPHEN 500 MG PO TABS
ORAL_TABLET | ORAL | Status: AC
  Filled 2019-11-28: qty 2

## 2019-11-28 MED ORDER — LACTATED RINGERS IV SOLN: INTRAVENOUS | Status: DC

## 2019-11-28 MED ORDER — HYDROCODONE-ACETAMINOPHEN 5-325 MG PO TABS: ORAL_TABLET | ORAL | 0 refills | Status: DC

## 2019-11-28 MED ORDER — ACETAMINOPHEN 500 MG PO TABS
1000.0000 mg | ORAL_TABLET | Freq: Once | ORAL | Status: AC
  Administered 2019-11-28: 1000 mg via ORAL

## 2019-11-28 MED ORDER — MIDAZOLAM HCL 5 MG/5ML IJ SOLN
INTRAMUSCULAR | Status: DC | PRN
  Administered 2019-11-28: 2 mg via INTRAVENOUS

## 2019-11-28 MED ORDER — FENTANYL CITRATE (PF) 250 MCG/5ML IJ SOLN
INTRAMUSCULAR | Status: DC | PRN
  Administered 2019-11-28 (×2): 25 ug via INTRAVENOUS

## 2019-11-28 MED ORDER — BUPIVACAINE HCL (PF) 0.5 % IJ SOLN
INTRAMUSCULAR | Status: DC | PRN
Start: 2019-11-28 — End: 2019-11-28
  Administered 2019-11-28: 8 mL

## 2019-11-28 MED ORDER — VANCOMYCIN HCL IN DEXTROSE 1-5 GM/200ML-% IV SOLN
1000.0000 mg | INTRAVENOUS | Status: AC
  Administered 2019-11-28: 1000 mg via INTRAVENOUS

## 2019-11-28 MED ORDER — VANCOMYCIN HCL IN DEXTROSE 1-5 GM/200ML-% IV SOLN
INTRAVENOUS | Status: AC
  Filled 2019-11-28: qty 200

## 2019-11-28 MED ORDER — PROPOFOL 10 MG/ML IV BOLUS
INTRAVENOUS | Status: AC
  Filled 2019-11-28: qty 20

## 2019-11-28 MED ORDER — 0.9 % SODIUM CHLORIDE (POUR BTL) OPTIME
TOPICAL | Status: DC | PRN
  Administered 2019-11-28: 200 mL

## 2019-11-28 MED ORDER — FENTANYL CITRATE (PF) 100 MCG/2ML IJ SOLN: 25.0000 ug | INTRAMUSCULAR | Status: DC | PRN

## 2019-11-28 MED ORDER — MIDAZOLAM HCL 2 MG/2ML IJ SOLN
INTRAMUSCULAR | Status: AC
  Filled 2019-11-28: qty 2

## 2019-11-28 MED ORDER — PROPOFOL 10 MG/ML IV BOLUS
INTRAVENOUS | Status: DC | PRN
  Administered 2019-11-28: 200 mg via INTRAVENOUS

## 2019-11-28 SURGICAL SUPPLY — 38 items
APL PRP STRL LF DISP 70% ISPRP (MISCELLANEOUS) ×1
BLADE SURG 15 STRL LF DISP TIS (BLADE) ×2 IMPLANT
BLADE SURG 15 STRL SS (BLADE) ×6
BNDG CMPR 9X4 STRL LF SNTH (GAUZE/BANDAGES/DRESSINGS) ×1
BNDG ELASTIC 3X5.8 VLCR STR LF (GAUZE/BANDAGES/DRESSINGS) ×3 IMPLANT
BNDG ESMARK 4X9 LF (GAUZE/BANDAGES/DRESSINGS) ×2 IMPLANT
BNDG GAUZE ELAST 4 BULKY (GAUZE/BANDAGES/DRESSINGS) ×3 IMPLANT
CHLORAPREP W/TINT 26 (MISCELLANEOUS) ×3 IMPLANT
CORD BIPOLAR FORCEPS 12FT (ELECTRODE) ×3 IMPLANT
COVER BACK TABLE 60X90IN (DRAPES) ×3 IMPLANT
COVER MAYO STAND STRL (DRAPES) ×3 IMPLANT
COVER WAND RF STERILE (DRAPES) IMPLANT
CUFF TOURN SGL QUICK 18X4 (TOURNIQUET CUFF) ×3 IMPLANT
DRAPE EXTREMITY T 121X128X90 (DISPOSABLE) ×3 IMPLANT
DRAPE SURG 17X23 STRL (DRAPES) ×3 IMPLANT
DRSG PAD ABDOMINAL 8X10 ST (GAUZE/BANDAGES/DRESSINGS) ×3 IMPLANT
GAUZE SPONGE 4X4 12PLY STRL (GAUZE/BANDAGES/DRESSINGS) ×3 IMPLANT
GAUZE XEROFORM 1X8 LF (GAUZE/BANDAGES/DRESSINGS) ×3 IMPLANT
GLOVE BIO SURGEON STRL SZ7.5 (GLOVE) ×3 IMPLANT
GLOVE BIOGEL PI IND STRL 7.0 (GLOVE) IMPLANT
GLOVE BIOGEL PI IND STRL 8 (GLOVE) ×1 IMPLANT
GLOVE BIOGEL PI INDICATOR 7.0 (GLOVE) ×4
GLOVE BIOGEL PI INDICATOR 8 (GLOVE) ×2
GOWN STRL REUS W/ TWL LRG LVL3 (GOWN DISPOSABLE) ×1 IMPLANT
GOWN STRL REUS W/TWL LRG LVL3 (GOWN DISPOSABLE) ×3
GOWN STRL REUS W/TWL XL LVL3 (GOWN DISPOSABLE) ×3 IMPLANT
NDL HYPO 25X1 1.5 SAFETY (NEEDLE) ×1 IMPLANT
NEEDLE HYPO 25X1 1.5 SAFETY (NEEDLE) ×3 IMPLANT
NS IRRIG 1000ML POUR BTL (IV SOLUTION) ×3 IMPLANT
PADDING CAST ABS 4INX4YD NS (CAST SUPPLIES) ×2
PADDING CAST ABS COTTON 4X4 ST (CAST SUPPLIES) ×1 IMPLANT
SET BASIN DAY SURGERY F.S. (CUSTOM PROCEDURE TRAY) ×3 IMPLANT
STOCKINETTE 4X48 STRL (DRAPES) ×3 IMPLANT
SUT ETHILON 4 0 PS 2 18 (SUTURE) ×3 IMPLANT
SYR BULB EAR ULCER 3OZ GRN STR (SYRINGE) ×3 IMPLANT
SYR CONTROL 10ML LL (SYRINGE) ×3 IMPLANT
TOWEL GREEN STERILE FF (TOWEL DISPOSABLE) ×6 IMPLANT
UNDERPAD 30X36 HEAVY ABSORB (UNDERPADS AND DIAPERS) ×3 IMPLANT

## 2019-11-28 NOTE — H&P (Signed)
  Bruce Vaughn is an 49 y.o. male.   Chief Complaint: carpal tunnel syndrome HPI: 49 yo male with numbness and tingling in hands.  Nocturnal symptoms.  Positive nerve conduction studies.  He wishes to have a left carpal tunnel release.  Allergies:  Allergies  Allergen Reactions  . Cephalosporins Other (See Comments)    Bumps  . Amoxicillin Itching and Rash  . Augmentin [Amoxicillin-Pot Clavulanate] Itching and Rash    Red spots  . Bactrim [Sulfamethoxazole-Trimethoprim] Swelling and Rash    Numbness and tingling in tounge  . Clindamycin/Lincomycin Itching and Rash  . Penicillins Rash  . Sulfa Antibiotics Itching and Rash    Past Medical History:  Diagnosis Date  . Asthma   . Depression     Past Surgical History:  Procedure Laterality Date  . I & D EXTREMITY  10/13/2011   Procedure: IRRIGATION AND DEBRIDEMENT EXTREMITY;  Surgeon: Tami Ribas, MD;  Location: [facility] OR;  Service: Orthopedics;  Laterality: Left;    Family History: Family History  Problem Relation Age of Onset  . Diabetes Mother   . Diabetes Maternal Grandmother   . Allergies Daughter        mold allergy, treatment x 1 year    Social History:   reports that he has quit smoking. His smoking use included cigarettes. He has a 38.00 pack-year smoking history. He has never used smokeless tobacco. He reports current alcohol use of about 0.1 standard drinks of alcohol per week. He reports current drug use. Frequency: 2.00 times per week. Drug: Marijuana.  Medications: No medications prior to admission.    No results found for this or any previous visit (from the past 48 hour(s)).  No results found.   A comprehensive review of systems was negative.  Height 5' 11.5" (1.816 m), weight 102.1 kg.  General appearance: alert, cooperative and appears stated age Head: Normocephalic, without obvious abnormality, atraumatic Neck: supple, symmetrical, trachea midline Cardio: regular rate and rhythm Resp: clear to  auscultation bilaterally Extremities: Intact sensation and capillary refill all digits.  +epl/fpl/io.  No wounds.  Pulses: 2+ and symmetric Skin: Skin color, texture, turgor normal. No rashes or lesions Neurologic: Grossly normal Incision/Wound: none  Assessment/Plan Left carpal tunnel syndrome.  Non operative and operative treatment options have been discussed with the patient and patient wishes to proceed with operative treatment. Risks, benefits, and alternatives of surgery have been discussed and the patient agrees with the plan of care.   Betha Loa 11/28/2019, 12:21 PM

## 2019-11-28 NOTE — Discharge Instructions (Addendum)
[facility] Instructions Hand Surgery  Wound Care: Keep your hand elevated above the level of your heart.  Do not allow it to dangle by your side.  Keep the dressing dry and do not remove it unless your doctor advises you to do so.  He will usually change it at the time of your post-op visit.  Moving your fingers is advised to stimulate circulation but will depend on the site of your surgery.  If you have a splint applied, your doctor will advise you regarding movement.  Activity: Do not drive or operate machinery today.  Rest today and then you may return to your normal activity and work as indicated by your physician.  Diet:  Drink liquids today or eat a light diet.  You may resume a regular diet tomorrow.    General expectations: Pain for two to three days. Fingers may become slightly swollen.  Call your doctor if any of the following occur: Severe pain not relieved by pain medication. Elevated temperature. Dressing soaked with blood. Inability to move fingers. White or bluish color to fingers.    NO TYLENOL PRODUCTS UNTIL 6:50 PM       Post Anesthesia Home Care Instructions  Activity: Get plenty of rest for the remainder of the day. A responsible individual must stay with you for 24 hours following the procedure.  For the next 24 hours, DO NOT: -Drive a car -Advertising copywriter -Drink alcoholic beverages -Take any medication unless instructed by your physician -Make any legal decisions or sign important papers.  Meals: Start with liquid foods such as gelatin or soup. Progress to regular foods as tolerated. Avoid greasy, spicy, heavy foods. If nausea and/or vomiting occur, drink only clear liquids until the nausea and/or vomiting subsides. Call your physician if vomiting continues.  Special Instructions/Symptoms: Your throat may feel dry or sore from the anesthesia or the breathing tube placed in your throat during surgery. If this causes discomfort, gargle with warm  salt water. The discomfort should disappear within 24 hours.  If you had a scopolamine patch placed behind your ear for the management of post- operative nausea and/or vomiting:  1. The medication in the patch is effective for 72 hours, after which it should be removed.  Wrap patch in a tissue and discard in the trash. Wash hands thoroughly with soap and water. 2. You may remove the patch earlier than 72 hours if you experience unpleasant side effects which may include dry mouth, dizziness or visual disturbances. 3. Avoid touching the patch. Wash your hands with soap and water after contact with the patch.

## 2019-11-28 NOTE — Anesthesia Procedure Notes (Signed)
Procedure Name: LMA Insertion Date/Time: 11/28/2019 1:50 PM Performed by: Lucinda Dell, CRNA Pre-anesthesia Checklist: Patient identified, Emergency Drugs available, Suction available and Patient being monitored Patient Re-evaluated:Patient Re-evaluated prior to induction Oxygen Delivery Method: Circle system utilized Preoxygenation: Pre-oxygenation with 100% oxygen Induction Type: IV induction Ventilation: Mask ventilation without difficulty LMA: LMA inserted LMA Size: 5.0 Tube type: Oral Number of attempts: 1 Placement Confirmation: positive ETCO2 and breath sounds checked- equal and bilateral Tube secured with: Tape Dental Injury: Teeth and Oropharynx as per pre-operative assessment

## 2019-11-28 NOTE — Transfer of Care (Signed)
Immediate Anesthesia Transfer of Care Note  Patient: JAI STEIL  Procedure(s) Performed: CARPAL TUNNEL RELEASE (Left Wrist)  Patient Location: PACU  Anesthesia Type:General  Level of Consciousness: awake, alert , oriented and patient cooperative  Airway & Oxygen Therapy: Patient Spontanous Breathing and Patient connected to face mask oxygen  Post-op Assessment: Report given to RN, Post -op Vital signs reviewed and stable and Patient moving all extremities  Post vital signs: Reviewed and stable  Last Vitals:  Vitals Value Taken Time  BP 130/88 11/28/19 1508  Temp 36.8 C 11/28/19 1508  Pulse 67 11/28/19 1508  Resp 16 11/28/19 1508  SpO2 100 % 11/28/19 1508    Last Pain:  Vitals:   11/28/19 1508  TempSrc: Oral  PainSc: 0-No pain         Complications: No complications documented.

## 2019-11-28 NOTE — Anesthesia Preprocedure Evaluation (Addendum)
Anesthesia Evaluation  Patient identified by MRN, date of birth, ID band Patient awake    Reviewed: Allergy & Precautions, H&P , NPO status , Patient's Chart, lab work & pertinent test results  Airway Mallampati: II  TM Distance: >3 FB Neck ROM: Full    Dental no notable dental hx. (+) Teeth Intact, Dental Advisory Given   Pulmonary asthma , former smoker,    Pulmonary exam normal breath sounds clear to auscultation       Cardiovascular negative cardio ROS   Rhythm:Regular Rate:Normal     Neuro/Psych Depression negative neurological ROS     GI/Hepatic Neg liver ROS, GERD  ,  Endo/Other  negative endocrine ROS  Renal/GU negative Renal ROS  negative genitourinary   Musculoskeletal   Abdominal   Peds  Hematology negative hematology ROS (+)   Anesthesia Other Findings   Reproductive/Obstetrics negative OB ROS                            Anesthesia Physical Anesthesia Plan  ASA: II  Anesthesia Plan: General   Post-op Pain Management:    Induction: Intravenous  PONV Risk Score and Plan: 3 and Ondansetron, Dexamethasone and Midazolam  Airway Management Planned: LMA  Additional Equipment:   Intra-op Plan:   Post-operative Plan: Extubation in OR  Informed Consent: I have reviewed the patients History and Physical, chart, labs and discussed the procedure including the risks, benefits and alternatives for the proposed anesthesia with the patient or authorized representative who has indicated his/her understanding and acceptance.     Dental advisory given  Plan Discussed with: CRNA  Anesthesia Plan Comments:         Anesthesia Quick Evaluation

## 2019-11-28 NOTE — Op Note (Signed)
11/28/2019 [facility]                              OPERATIVE REPORT   PREOPERATIVE DIAGNOSIS:  Left carpal tunnel syndrome.  POSTOPERATIVE DIAGNOSIS:  Left carpal tunnel syndrome.  PROCEDURE:  Left carpal tunnel release.  SURGEON:  Betha Loa, MD  ASSISTANT:  none.  ANESTHESIA: General  IV FLUIDS:  Per anesthesia flow sheet.  ESTIMATED BLOOD LOSS:  Minimal.  COMPLICATIONS:  None.  SPECIMENS:  None.  TOURNIQUET TIME:    Total Tourniquet Time Documented: Upper Arm (Left) - 15 minutes Total: Upper Arm (Left) - 15 minutes   DISPOSITION:  Stable to PACU.  LOCATION: [facility]  INDICATIONS:  49 yo male with numbness and tingling left hand.  Positive nerve conduction studies.  He wishes to have a carpal tunnel release for management of his symptoms.  Risks, benefits and alternatives of surgery were discussed including the risk of blood loss; infection; damage to nerves, vessels, tendons, ligaments, bone; failure of surgery; need for additional surgery; complications with wound healing; continued pain; recurrence of carpal tunnel syndrome; and damage to motor branch. He voiced understanding of these risks and elected to proceed.   OPERATIVE COURSE:  After being identified preoperatively by myself, the patient and I agreed upon the procedure and site of procedure.  The surgical site was marked.  The risks, benefits, and alternatives of the surgery were reviewed and he wished to proceed.  Surgical consent had been signed.  He was given IV vancomycin as preoperative antibiotic prophylaxis.  He was transferred to the operating room and placed on the operating room table in supine position with the Left upper extremity on an armboard.  General anesthesia was induced by the anesthesiologist.  Left upper extremity was prepped and draped in normal sterile orthopaedic fashion.  A surgical pause was performed between the surgeons, anesthesia, and operating room  staff, and all were in agreement as to the patient, procedure, and site of procedure.  Tourniquet at the proximal aspect of the extremity was inflated to 250 mmHg after exsanguination of the arm with an Esmarch bandage  Incision was made over the transverse carpal ligament and carried into the subcutaneous tissues by spreading technique.  Bipolar electrocautery was used to obtain hemostasis.  The palmar fascia was sharply incised.  The transverse carpal ligament was identified and sharply incised.  It was incised distally first.  The flexor tendons were identified.  The flexor tendon to the ring finger was identified and retracted radially.  The transverse carpal ligament was then incised proximally.  Scissors were used to split the distal aspect of the volar antebrachial fascia.  A finger was placed into the wound to ensure complete decompression, which was the case.  The nerve was examined.  It was flattened and hyperemic.  The motor branch was identified and was intact.  The wound was copiously irrigated with sterile saline.  It was then closed with 4-0 nylon in a horizontal mattress fashion.  It was injected with 0.25% plain Marcaine to aid in postoperative analgesia.  It was dressed with sterile Xeroform, 4x4s, an ABD, and wrapped with Kerlix and an Ace bandage.  Tourniquet was deflated at 15 minutes.  Fingertips were pink with brisk capillary refill after deflation of the tourniquet.  Operative drapes were broken down.  The patient was awoken from anesthesia safely.  He was transferred back to stretcher and  taken to the PACU in stable condition.  I will see him back in the office in 1 week for postoperative followup.  I will give him a prescription for Norco 5/325 1-2 tabs PO q6 hours prn pain, dispense # 20.    Leanora Cover, MD Electronically signed, 11/28/19

## 2019-11-28 NOTE — Anesthesia Postprocedure Evaluation (Signed)
Anesthesia Post Note  Patient: Bruce Vaughn  Procedure(s) Performed: CARPAL TUNNEL RELEASE (Left Wrist)     Patient location during evaluation: PACU Anesthesia Type: General Level of consciousness: awake and alert Pain management: pain level controlled Vital Signs Assessment: post-procedure vital signs reviewed and stable Respiratory status: spontaneous breathing, nonlabored ventilation and respiratory function stable Cardiovascular status: blood pressure returned to baseline and stable Postop Assessment: no apparent nausea or vomiting Anesthetic complications: no   No complications documented.  Last Vitals:  Vitals:   11/28/19 1446 11/28/19 1447  BP:    Pulse: 68 66  Resp: 14 16  Temp:    SpO2: 100% 100%    Last Pain:  Vitals:   11/28/19 1445  TempSrc:   PainSc: 0-No pain                 Talene Glastetter,W. EDMOND

## 2019-12-01 ENCOUNTER — Encounter (HOSPITAL_BASED_OUTPATIENT_CLINIC_OR_DEPARTMENT_OTHER): Payer: Self-pay | Admitting: Orthopedic Surgery

## 2020-01-09 ENCOUNTER — Other Ambulatory Visit: Payer: Self-pay | Admitting: Orthopedic Surgery

## 2020-02-20 ENCOUNTER — Encounter (HOSPITAL_BASED_OUTPATIENT_CLINIC_OR_DEPARTMENT_OTHER): Payer: Self-pay | Admitting: Orthopedic Surgery

## 2020-02-20 ENCOUNTER — Other Ambulatory Visit: Payer: Self-pay

## 2020-02-24 ENCOUNTER — Other Ambulatory Visit (HOSPITAL_COMMUNITY)
Admission: RE | Admit: 2020-02-24 | Discharge: 2020-02-24 | Disposition: A | Discharge status: Home / Self Care | Payer: BC Managed Care – PPO | Source: Ambulatory Visit | Attending: Orthopedic Surgery | Admitting: Orthopedic Surgery

## 2020-02-24 DIAGNOSIS — Z01812 Encounter for preprocedural laboratory examination: Secondary | ICD-10-CM | POA: Insufficient documentation

## 2020-02-24 DIAGNOSIS — Z20822 Contact with and (suspected) exposure to covid-19: Secondary | ICD-10-CM | POA: Insufficient documentation

## 2020-02-24 LAB — SARS CORONAVIRUS 2 (TAT 6-24 HRS): SARS Coronavirus 2: NEGATIVE

## 2020-02-26 NOTE — Progress Notes (Signed)
Ensure drink given to patients wife. Setzer, Don Broach

## 2020-02-27 ENCOUNTER — Ambulatory Visit (HOSPITAL_BASED_OUTPATIENT_CLINIC_OR_DEPARTMENT_OTHER): Payer: BC Managed Care – PPO | Admitting: Anesthesiology

## 2020-02-27 ENCOUNTER — Other Ambulatory Visit: Payer: Self-pay

## 2020-02-27 ENCOUNTER — Encounter (HOSPITAL_BASED_OUTPATIENT_CLINIC_OR_DEPARTMENT_OTHER): Payer: Self-pay | Admitting: Orthopedic Surgery

## 2020-02-27 ENCOUNTER — Ambulatory Visit (HOSPITAL_BASED_OUTPATIENT_CLINIC_OR_DEPARTMENT_OTHER)
Admission: RE | Admit: 2020-02-27 | Discharge: 2020-02-27 | Disposition: A | Discharge status: Home / Self Care | Payer: BC Managed Care – PPO | Attending: Orthopedic Surgery | Admitting: Orthopedic Surgery

## 2020-02-27 ENCOUNTER — Encounter (HOSPITAL_BASED_OUTPATIENT_CLINIC_OR_DEPARTMENT_OTHER)
Admission: RE | Disposition: A | Discharge status: Home / Self Care | Payer: Self-pay | Source: Home / Self Care | Attending: Orthopedic Surgery

## 2020-02-27 DIAGNOSIS — Z882 Allergy status to sulfonamides status: Secondary | ICD-10-CM | POA: Insufficient documentation

## 2020-02-27 DIAGNOSIS — G5601 Carpal tunnel syndrome, right upper limb: Secondary | ICD-10-CM | POA: Insufficient documentation

## 2020-02-27 DIAGNOSIS — J45909 Unspecified asthma, uncomplicated: Secondary | ICD-10-CM | POA: Insufficient documentation

## 2020-02-27 DIAGNOSIS — Z880 Allergy status to penicillin: Secondary | ICD-10-CM | POA: Insufficient documentation

## 2020-02-27 DIAGNOSIS — Z881 Allergy status to other antibiotic agents status: Secondary | ICD-10-CM | POA: Diagnosis not present

## 2020-02-27 DIAGNOSIS — Z87891 Personal history of nicotine dependence: Secondary | ICD-10-CM | POA: Insufficient documentation

## 2020-02-27 HISTORY — PX: CARPAL TUNNEL RELEASE: SHX101

## 2020-02-27 SURGERY — CARPAL TUNNEL RELEASE
Anesthesia: Regional | Site: Wrist | Laterality: Right

## 2020-02-27 MED ORDER — BUPIVACAINE HCL (PF) 0.25 % IJ SOLN
INTRAMUSCULAR | Status: AC
  Filled 2020-02-27: qty 30

## 2020-02-27 MED ORDER — PHENYLEPHRINE 40 MCG/ML (10ML) SYRINGE FOR IV PUSH (FOR BLOOD PRESSURE SUPPORT)
PREFILLED_SYRINGE | INTRAVENOUS | Status: AC
  Filled 2020-02-27: qty 10

## 2020-02-27 MED ORDER — MEPERIDINE HCL 25 MG/ML IJ SOLN: 6.2500 mg | INTRAMUSCULAR | Status: DC | PRN

## 2020-02-27 MED ORDER — PROPOFOL 500 MG/50ML IV EMUL
INTRAVENOUS | Status: AC
  Filled 2020-02-27: qty 50

## 2020-02-27 MED ORDER — PROPOFOL 10 MG/ML IV BOLUS
INTRAVENOUS | Status: AC
  Filled 2020-02-27: qty 20

## 2020-02-27 MED ORDER — FENTANYL CITRATE (PF) 100 MCG/2ML IJ SOLN
INTRAMUSCULAR | Status: DC | PRN
Start: 2020-02-27 — End: 2020-02-27
  Administered 2020-02-27 (×2): 50 ug via INTRAVENOUS

## 2020-02-27 MED ORDER — HYDROMORPHONE HCL 1 MG/ML IJ SOLN: 0.2500 mg | INTRAMUSCULAR | Status: DC | PRN

## 2020-02-27 MED ORDER — MIDAZOLAM HCL 5 MG/5ML IJ SOLN
INTRAMUSCULAR | Status: DC | PRN
  Administered 2020-02-27: 2 mg via INTRAVENOUS

## 2020-02-27 MED ORDER — BUPIVACAINE HCL (PF) 0.5 % IJ SOLN
INTRAMUSCULAR | Status: AC
  Filled 2020-02-27: qty 30

## 2020-02-27 MED ORDER — LIDOCAINE 2% (20 MG/ML) 5 ML SYRINGE
INTRAMUSCULAR | Status: AC
  Filled 2020-02-27: qty 5

## 2020-02-27 MED ORDER — SUCCINYLCHOLINE CHLORIDE 200 MG/10ML IV SOSY
PREFILLED_SYRINGE | INTRAVENOUS | Status: AC
  Filled 2020-02-27: qty 10

## 2020-02-27 MED ORDER — MIDAZOLAM HCL 2 MG/2ML IJ SOLN
INTRAMUSCULAR | Status: AC
  Filled 2020-02-27: qty 2

## 2020-02-27 MED ORDER — VANCOMYCIN HCL IN DEXTROSE 1-5 GM/200ML-% IV SOLN
1000.0000 mg | INTRAVENOUS | Status: AC
  Administered 2020-02-27: 1000 mg via INTRAVENOUS

## 2020-02-27 MED ORDER — ONDANSETRON HCL 4 MG/2ML IJ SOLN
INTRAMUSCULAR | Status: AC
  Filled 2020-02-27: qty 2

## 2020-02-27 MED ORDER — EPHEDRINE 5 MG/ML INJ
INTRAVENOUS | Status: AC
  Filled 2020-02-27: qty 10

## 2020-02-27 MED ORDER — BUPIVACAINE HCL (PF) 0.25 % IJ SOLN
INTRAMUSCULAR | Status: DC | PRN
  Administered 2020-02-27: 8 mL

## 2020-02-27 MED ORDER — HYDROCODONE-ACETAMINOPHEN 5-325 MG PO TABS: ORAL_TABLET | ORAL | 0 refills | Status: AC

## 2020-02-27 MED ORDER — LACTATED RINGERS IV SOLN: INTRAVENOUS | Status: DC

## 2020-02-27 MED ORDER — PROPOFOL 10 MG/ML IV BOLUS
INTRAVENOUS | Status: DC | PRN
  Administered 2020-02-27: 20 mg via INTRAVENOUS

## 2020-02-27 MED ORDER — PROPOFOL 500 MG/50ML IV EMUL
INTRAVENOUS | Status: DC | PRN
  Administered 2020-02-27: 125 ug/kg/min via INTRAVENOUS

## 2020-02-27 MED ORDER — LIDOCAINE HCL (PF) 0.5 % IJ SOLN
INTRAMUSCULAR | Status: DC | PRN
  Administered 2020-02-27: 35 mL via INTRAVENOUS

## 2020-02-27 MED ORDER — FENTANYL CITRATE (PF) 100 MCG/2ML IJ SOLN
INTRAMUSCULAR | Status: AC
  Filled 2020-02-27: qty 2

## 2020-02-27 MED ORDER — ONDANSETRON HCL 4 MG/2ML IJ SOLN: 4.0000 mg | Freq: Once | INTRAMUSCULAR | Status: DC | PRN

## 2020-02-27 MED ORDER — VANCOMYCIN HCL IN DEXTROSE 1-5 GM/200ML-% IV SOLN
INTRAVENOUS | Status: AC
  Filled 2020-02-27: qty 200

## 2020-02-27 SURGICAL SUPPLY — 39 items
APL PRP STRL LF DISP 70% ISPRP (MISCELLANEOUS) ×1
BLADE SURG 15 STRL LF DISP TIS (BLADE) ×2 IMPLANT
BLADE SURG 15 STRL SS (BLADE) ×6
BNDG CMPR 9X4 STRL LF SNTH (GAUZE/BANDAGES/DRESSINGS) ×1
BNDG ELASTIC 3X5.8 VLCR STR LF (GAUZE/BANDAGES/DRESSINGS) ×3 IMPLANT
BNDG ESMARK 4X9 LF (GAUZE/BANDAGES/DRESSINGS) ×2 IMPLANT
BNDG GAUZE ELAST 4 BULKY (GAUZE/BANDAGES/DRESSINGS) ×3 IMPLANT
CHLORAPREP W/TINT 26 (MISCELLANEOUS) ×3 IMPLANT
CORD BIPOLAR FORCEPS 12FT (ELECTRODE) ×3 IMPLANT
COVER BACK TABLE 60X90IN (DRAPES) ×3 IMPLANT
COVER MAYO STAND STRL (DRAPES) ×3 IMPLANT
CUFF TOURN SGL QUICK 18X4 (TOURNIQUET CUFF) ×3 IMPLANT
DRAPE EXTREMITY T 121X128X90 (DISPOSABLE) ×3 IMPLANT
DRAPE SURG 17X23 STRL (DRAPES) ×3 IMPLANT
DRSG PAD ABDOMINAL 8X10 ST (GAUZE/BANDAGES/DRESSINGS) ×3 IMPLANT
GAUZE SPONGE 4X4 12PLY STRL (GAUZE/BANDAGES/DRESSINGS) ×3 IMPLANT
GAUZE XEROFORM 1X8 LF (GAUZE/BANDAGES/DRESSINGS) ×3 IMPLANT
GLOVE BIO SURGEON STRL SZ 6.5 (GLOVE) ×1 IMPLANT
GLOVE BIO SURGEON STRL SZ7.5 (GLOVE) ×3 IMPLANT
GLOVE BIO SURGEONS STRL SZ 6.5 (GLOVE) ×1
GLOVE BIOGEL PI IND STRL 6.5 (GLOVE) IMPLANT
GLOVE BIOGEL PI IND STRL 8 (GLOVE) ×1 IMPLANT
GLOVE BIOGEL PI INDICATOR 6.5 (GLOVE) ×4
GLOVE BIOGEL PI INDICATOR 8 (GLOVE) ×2
GOWN STRL REUS W/ TWL LRG LVL3 (GOWN DISPOSABLE) ×1 IMPLANT
GOWN STRL REUS W/TWL LRG LVL3 (GOWN DISPOSABLE) ×3
GOWN STRL REUS W/TWL XL LVL3 (GOWN DISPOSABLE) ×3 IMPLANT
NDL HYPO 25X1 1.5 SAFETY (NEEDLE) ×1 IMPLANT
NEEDLE HYPO 25X1 1.5 SAFETY (NEEDLE) ×3 IMPLANT
NS IRRIG 1000ML POUR BTL (IV SOLUTION) ×3 IMPLANT
PACK BASIN DAY SURGERY FS (CUSTOM PROCEDURE TRAY) ×3 IMPLANT
PADDING CAST ABS 4INX4YD NS (CAST SUPPLIES) ×2
PADDING CAST ABS COTTON 4X4 ST (CAST SUPPLIES) ×1 IMPLANT
STOCKINETTE 4X48 STRL (DRAPES) ×3 IMPLANT
SUT ETHILON 4 0 PS 2 18 (SUTURE) ×3 IMPLANT
SYR BULB EAR ULCER 3OZ GRN STR (SYRINGE) ×3 IMPLANT
SYR CONTROL 10ML LL (SYRINGE) ×3 IMPLANT
TOWEL GREEN STERILE FF (TOWEL DISPOSABLE) ×6 IMPLANT
UNDERPAD 30X36 HEAVY ABSORB (UNDERPADS AND DIAPERS) ×3 IMPLANT

## 2020-02-27 NOTE — Transfer of Care (Signed)
Immediate Anesthesia Transfer of Care Note  Patient: Bruce Vaughn  Procedure(s) Performed: CARPAL TUNNEL RELEASE (Right Wrist)  Patient Location: PACU  Anesthesia Type:MAC and Bier block  Level of Consciousness: awake, alert , oriented, drowsy and patient cooperative  Airway & Oxygen Therapy: Patient Spontanous Breathing and Patient connected to face mask oxygen  Post-op Assessment: Report given to RN and Post -op Vital signs reviewed and stable  Post vital signs: Reviewed and stable  Last Vitals:  Vitals Value Taken Time  BP 125/71 02/27/20 1437  Temp    Pulse 68 02/27/20 1438  Resp 17 02/27/20 1438  SpO2 99 % 02/27/20 1438  Vitals shown include unvalidated device data.  Last Pain:  Vitals:   02/27/20 1209  TempSrc: Oral  PainSc: 0-No pain         Complications: No complications documented.

## 2020-02-27 NOTE — H&P (Signed)
  Bruce Vaughn is an 49 y.o. male.   Chief Complaint: carpal tunnel syndrome HPI: 49 yo male with numbness and tingling right hand.  Nocturnal symptoms that wake him.  Positive nerve conduction studies.  He wishes to have a right carpal tunnel release.  Allergies:  Allergies  Allergen Reactions  . Bactrim [Sulfamethoxazole-Trimethoprim] Swelling and Rash    Numbness and tingling in tounge  . Cephalosporins Other (See Comments)    Bumps  . Amoxicillin Itching and Rash  . Augmentin [Amoxicillin-Pot Clavulanate] Itching and Rash    Red spots  . Clindamycin/Lincomycin Itching and Rash  . Penicillins Rash  . Sulfa Antibiotics Itching and Rash    Past Medical History:  Diagnosis Date  . Asthma   . Depression     Past Surgical History:  Procedure Laterality Date  . CARPAL TUNNEL RELEASE Left 11/28/2019   Procedure: CARPAL TUNNEL RELEASE;  Surgeon: Betha Loa, MD;  Location: [facility];  Service: Orthopedics;  Laterality: Left;  . I & D EXTREMITY  10/13/2011   Procedure: IRRIGATION AND DEBRIDEMENT EXTREMITY;  Surgeon: Tami Ribas, MD;  Location: [facility] OR;  Service: Orthopedics;  Laterality: Left;    Family History: Family History  Problem Relation Age of Onset  . Diabetes Mother   . Diabetes Maternal Grandmother   . Allergies Daughter        mold allergy, treatment x 1 year    Social History:   reports that he quit smoking about 6 months ago. His smoking use included cigarettes. He has a 38.00 pack-year smoking history. He has never used smokeless tobacco. He reports previous alcohol use. He reports current drug use. Frequency: 2.00 times per week. Drug: Marijuana.  Medications: No medications prior to admission.    No results found for this or any previous visit (from the past 48 hour(s)).  No results found.   A comprehensive review of systems was negative.  Blood pressure 129/84, pulse 67, temperature 97.9 F (36.6 C), temperature source Oral,  resp. rate 17, height 6' (1.829 m), weight 107 kg, SpO2 100 %.  General appearance: alert, cooperative and appears stated age Head: Normocephalic, without obvious abnormality, atraumatic Neck: supple, symmetrical, trachea midline Cardio: regular rate and rhythm Resp: clear to auscultation bilaterally Extremities: Intact sensation and capillary refill all digits.  +epl/fpl/io.  No wounds.  Pulses: 2+ and symmetric Skin: Skin color, texture, turgor normal. No rashes or lesions Neurologic: Grossly normal Incision/Wound: none  Assessment/Plan Right carpal tunnel syndrome.  Non operative and operative treatment options have been discussed with the patient and patient wishes to proceed with operative treatment. Risks, benefits, and alternatives of surgery have been discussed and the patient agrees with the plan of care.   Betha Loa 02/27/2020, 12:33 PM

## 2020-02-27 NOTE — Anesthesia Preprocedure Evaluation (Signed)
Anesthesia Evaluation  Patient identified by MRN, date of birth, ID band Patient awake    Reviewed: Allergy & Precautions, NPO status , Patient's Chart, lab work & pertinent test results  Airway Mallampati: II  TM Distance: >3 FB Neck ROM: Full    Dental   Pulmonary former smoker,    Pulmonary exam normal        Cardiovascular Normal cardiovascular exam     Neuro/Psych Depression    GI/Hepatic GERD  Medicated and Controlled,  Endo/Other    Renal/GU      Musculoskeletal   Abdominal   Peds  Hematology   Anesthesia Other Findings   Reproductive/Obstetrics                             Anesthesia Physical Anesthesia Plan  ASA: II  Anesthesia Plan: Bier Block and Bier Block-LIDOCAINE ONLY   Post-op Pain Management:    Induction: Intravenous  PONV Risk Score and Plan: 1 and Treatment may vary due to age or medical condition  Airway Management Planned: Nasal Cannula  Additional Equipment:   Intra-op Plan:   Post-operative Plan:   Informed Consent: I have reviewed the patients History and Physical, chart, labs and discussed the procedure including the risks, benefits and alternatives for the proposed anesthesia with the patient or authorized representative who has indicated his/her understanding and acceptance.       Plan Discussed with: CRNA and Surgeon  Anesthesia Plan Comments:         Anesthesia Quick Evaluation

## 2020-02-27 NOTE — Anesthesia Postprocedure Evaluation (Signed)
Anesthesia Post Note  Patient: KEATYN JAWAD  Procedure(s) Performed: CARPAL TUNNEL RELEASE (Right Wrist)     Patient location during evaluation: PACU Anesthesia Type: Bier Block Level of consciousness: awake and alert Pain management: pain level controlled Vital Signs Assessment: post-procedure vital signs reviewed and stable Respiratory status: spontaneous breathing, nonlabored ventilation, respiratory function stable and patient connected to nasal cannula oxygen Cardiovascular status: stable and blood pressure returned to baseline Postop Assessment: no apparent nausea or vomiting Anesthetic complications: no   No complications documented.  Last Vitals:  Vitals:   02/27/20 1454 02/27/20 1500  BP:  (!) 154/87  Pulse:  62  Resp:  16  Temp:  36.4 C  SpO2: 97% 99%    Last Pain:  Vitals:   02/27/20 1500  TempSrc:   PainSc: 0-No pain                 Marieme Mcmackin DAVID

## 2020-02-27 NOTE — Discharge Instructions (Addendum)

## 2020-02-27 NOTE — Op Note (Signed)
02/27/2020 [facility]                              OPERATIVE REPORT   PREOPERATIVE DIAGNOSIS:  Right carpal tunnel syndrome.  POSTOPERATIVE DIAGNOSIS:  Right carpal tunnel syndrome.  PROCEDURE:  Right carpal tunnel release.  SURGEON:  Betha Loa, MD  ASSISTANT:  none.  ANESTHESIA: Bier block with sedation  IV FLUIDS:  Per anesthesia flow sheet.  ESTIMATED BLOOD LOSS:  Minimal.  COMPLICATIONS:  None.  SPECIMENS:  None.  TOURNIQUET TIME:    Total Tourniquet Time Documented: Forearm (Right) - 37 minutes Total: Forearm (Right) - 37 minutes   DISPOSITION:  Stable to PACU.  LOCATION: [facility]  INDICATIONS:  49 yo male with numbness and tingling right hand.  Positive nerve conduction studies.  Nocturnal symptoms that wake him.   He wishes to have a carpal tunnel release for management of his symptoms.  Risks, benefits and alternatives of surgery were discussed including the risk of blood loss; infection; damage to nerves, vessels, tendons, ligaments, bone; failure of surgery; need for additional surgery; complications with wound healing; continued pain; recurrence of carpal tunnel syndrome; and damage to motor branch. He voiced understanding of these risks and elected to proceed.   OPERATIVE COURSE:  After being identified preoperatively by myself, the patient and I agreed upon the procedure and site of procedure.  The surgical site was marked.  The risks, benefits, and alternatives of the surgery were reviewed and he wished to proceed.  Surgical consent had been signed.  He was given IV antibiotics as preoperative antibiotic prophylaxis.  He was transferred to the operating room and placed on the operating room table in supine position with the Right upper extremity on an armboard.  Bier block anesthesia was induced by the anesthesiologist.  Right upper extremity was prepped and draped in normal sterile orthopaedic fashion.  A surgical pause was  performed between the surgeons, anesthesia, and operating room staff, and all were in agreement as to the patient, procedure, and site of procedure.  Tourniquet at the proximal aspect of the forearm had been inflated for the Bier block  Incision was made over the transverse carpal ligament and carried into the subcutaneous tissues by spreading technique.  Bipolar electrocautery was used to obtain hemostasis.  The palmar fascia was sharply incised.  The transverse carpal ligament was identified and sharply incised.  It was incised distally first.  The flexor tendons were identified.  The flexor tendon to the ring finger was identified and retracted radially.  The transverse carpal ligament was then incised proximally.  Scissors were used to split the distal aspect of the volar antebrachial fascia.  A finger was placed into the wound to ensure complete decompression, which was the case.  The nerve was examined.  It was swollen and adherent to the radial leaflet.  The motor branch was identified and was intact.  The wound was copiously irrigated with sterile saline.  It was then closed with 4-0 nylon in a horizontal mattress fashion.  It was injected with 0.25% plain Marcaine to aid in postoperative analgesia.  It was dressed with sterile Xeroform, 4x4s, an ABD, and wrapped with Kerlix and an Ace bandage.  Tourniquet was deflated at 37 minutes.  Fingertips were pink with brisk capillary refill after deflation of the tourniquet.  Operative drapes were broken down.  The patient was awoken from anesthesia safely.  He  was transferred back to stretcher and taken to the PACU in stable condition.  I will see him back in the office in 1 week for postoperative followup.  I will give him a prescription for Norco 5/325 1-2 tabs PO q6 hours prn pain, dispense # 20.    Betha Loa, MD Electronically signed, 02/27/20

## 2020-03-01 ENCOUNTER — Encounter (HOSPITAL_BASED_OUTPATIENT_CLINIC_OR_DEPARTMENT_OTHER): Payer: Self-pay | Admitting: Orthopedic Surgery
